# Patient Record
Sex: Female | Born: 1935
Health system: Southern US, Community
[De-identification: ages and names within clinical notes are randomized; demographics above are authoritative.]

## PROBLEM LIST (undated history)

## (undated) DIAGNOSIS — I739 Peripheral vascular disease, unspecified: Secondary | ICD-10-CM

## (undated) DIAGNOSIS — I10 Essential (primary) hypertension: Secondary | ICD-10-CM

## (undated) DIAGNOSIS — J449 Chronic obstructive pulmonary disease, unspecified: Secondary | ICD-10-CM

## (undated) HISTORY — DX: Peripheral vascular disease, unspecified: I73.9

## (undated) HISTORY — DX: Essential (primary) hypertension: I10

## (undated) HISTORY — PX: CYST EXCISION: SHX5701

---

## 2003-11-06 ENCOUNTER — Ambulatory Visit: Payer: Self-pay | Admitting: Orthopedic Surgery

## 2003-11-25 ENCOUNTER — Ambulatory Visit: Payer: Self-pay | Admitting: Orthopedic Surgery

## 2004-01-27 ENCOUNTER — Ambulatory Visit: Payer: Self-pay | Admitting: Orthopedic Surgery

## 2009-05-26 ENCOUNTER — Encounter: Payer: Self-pay | Admitting: Cardiovascular Disease

## 2009-05-29 ENCOUNTER — Encounter: Payer: Self-pay | Admitting: Cardiovascular Disease

## 2009-06-17 ENCOUNTER — Ambulatory Visit: Payer: Self-pay | Admitting: Cardiovascular Disease

## 2009-06-17 DIAGNOSIS — I70219 Atherosclerosis of native arteries of extremities with intermittent claudication, unspecified extremity: Secondary | ICD-10-CM | POA: Insufficient documentation

## 2010-02-03 NOTE — Assessment & Plan Note (Signed)
Summary: npv and coronary eval   Visit Type:  Initial Consult Referring Provider:  Dr. Linna Darner Primary Provider:  Geanie Berlin, MD  CC:  new patient coronary evaluation.  Pt reports some episodes of swelling but thinks it is due to hx of injury to ankle and hip replacement.  History of Present Illness: 77 year-old woman referred for initial evaluation of bilateral leg pain. She states that both legs have an aching, tired sensation. She denies cramps or aches in the calves. No sores or ulcerations. She has swelling of the right foot ever since an old fracture, but no leg edema. No chest pain, dyspnea, or other complaints. She leads a fairly sedentery lifestyle.  Preventive Screening-Counseling & Management  Alcohol-Tobacco     Smoking Status: current  Current Medications (verified): 1)  Meloxicam 15 Mg Tabs (Meloxicam) .... Take One Tablet By Mouth Once Daily 2)  Travatan Z 0.004 % Soln (Travoprost) .... Instill One Drop Daily Uad 3)  Timolol Maleate 0.25 % Soln (Timolol Maleate) .Marland Kitchen.. 1 Gtt in Left Eye Each Morning 4)  Lisinopril 20 Mg Tabs (Lisinopril) .... Take One Tablet Once Daily 5)  Alprazolam 0.5 Mg Tabs (Alprazolam) .... Take One Tablet Two Times A Day 6)  Ibuprofen 200 Mg Tabs (Ibuprofen) .... As Needed For Pain 7)  Aspirin 81 Mg Tabs (Aspirin) .... One Tablet Every Other Day  Allergies (verified): No Known Drug Allergies  Past History:  Past medical, surgical, family and social histories (including risk factors) reviewed, and no changes noted (except as noted below).  Past Medical History: Hypertension Osteoarthritis Lower extremity PAD  Past Surgical History: Left hip replacement 1998 Lumbar back surgery 1977 cataracts 2010 Remote appendectomy  Family History: Reviewed history and no changes required. Father - MI 62 Brother - HTN  Social History: Reviewed history and no changes required. Tobacco Use - Yes.  Widowed-3 children Homemaker Smoking Status:   current  Review of Systems       Positive for constipation, fatigue, anxiety, and depression. Otherwise negative except as per HPI  Vital Signs:  Patient profile:   77 year old female Height:      67 inches Weight:      128 pounds BMI:     20.12 Pulse rate:   100 / minute Pulse rhythm:   regular Resp:     16 per minute BP sitting:   184 / 88  (left arm) Cuff size:   regular  Vitals Entered By: Judithe Modest CMA (June 17, 2009 2:18 PM)   Physical Exam  General:  Pt is alert and oriented, anxious woman in no acute distress. HEENT: normal Neck: normal carotid upstrokes without bruits, JVP normal Lungs: CTA CV: RRR without murmur or gallop Abd: soft, NT, positive BS, no bruit, no organomegaly Ext: trace bilateral pedal edema.  Skin: warm and dry without rash    EKG  Procedure date:  06/17/2009  Findings:      NSR, cannot rule out age-indeterminate anterior infarct, nonspecific ST-T abnormality, HR 100 bpm.  ABI's  Procedure date:  05/29/2009  Findings:      Right ABI: 0.67 Left ABI: 0.59 Segmental pressure analysis suggestive of SFA and distal arterial disease bilaterally.   Impression & Recommendations:  Problem # 1:  ATHEROSLERO NATV ART EXTREM W/INTERMIT CLAUDICAT (ICD-440.21) The patient has evidence of moderate bilateral lower extremity PAD by exam and segmental pressures. The noninvasive studies were reviewed today and they suggest infrainguinal disease bilaterally. I do not think the patient has  evidence of critical limb ischemia and her leg pain is atypical. We discussed further evaluation with CTA, but she does not want to pursue this at the present time. Her transportation is limited and she is extremely anxious about further testing. It is reasonable to observe her for a period of time, and will therefore plan to see her back in 6 months for follow-up evaluation.   She is on appropriate medical therapy with an ACE-Inhibitor for HTN and antiplatelet Rx  with ASA. I would also recommend a statin to goal LDL less than 100 mg/dL. I will defer this to her PCP, Dr Linna Darner. We also reviewed the importance of tobacco cessation as it relates to PAD outcomes and risk of MI/stroke/etc.  If the patient develops progressive leg pain or symptoms of limb ischemia, I would be happy to see her back sooner in follow-up.  Orders: EKG w/ Interpretation (93000)  Patient Instructions: 1)  Your physician recommends that you continue on your current medications as directed. Please refer to the Current Medication list given to you today. 2)  Your physician wants you to follow-up in:   6 MONTHS. You will receive a reminder letter in the mail two months in advance. If you don't receive a letter, please call our office to schedule the follow-up appointment.

## 2010-02-03 NOTE — Letter (Signed)
Summary: [facility] Office Note  [facility] Note   Imported By: Roderic Ovens 06/18/2009 11:04:49  _____________________________________________________________________  External Attachment:    Type:   Image     Comment:   External Document

## 2010-06-10 ENCOUNTER — Other Ambulatory Visit: Payer: Self-pay | Admitting: Orthopedic Surgery

## 2010-06-10 ENCOUNTER — Ambulatory Visit (HOSPITAL_COMMUNITY)
Admission: RE | Admit: 2010-06-10 | Discharge: 2010-06-10 | Disposition: A | Discharge status: Home / Self Care | Payer: Medicare Other | Source: Ambulatory Visit | Attending: Orthopedic Surgery | Admitting: Orthopedic Surgery

## 2010-06-10 ENCOUNTER — Other Ambulatory Visit (HOSPITAL_COMMUNITY): Payer: Self-pay | Admitting: Orthopedic Surgery

## 2010-06-10 ENCOUNTER — Encounter (HOSPITAL_COMMUNITY): Payer: Medicare Other

## 2010-06-10 DIAGNOSIS — Z01818 Encounter for other preprocedural examination: Secondary | ICD-10-CM

## 2010-06-10 DIAGNOSIS — Z01812 Encounter for preprocedural laboratory examination: Secondary | ICD-10-CM | POA: Insufficient documentation

## 2010-06-10 DIAGNOSIS — R9431 Abnormal electrocardiogram [ECG] [EKG]: Secondary | ICD-10-CM | POA: Insufficient documentation

## 2010-06-10 DIAGNOSIS — I517 Cardiomegaly: Secondary | ICD-10-CM | POA: Insufficient documentation

## 2010-06-10 DIAGNOSIS — Z01810 Encounter for preprocedural cardiovascular examination: Secondary | ICD-10-CM | POA: Insufficient documentation

## 2010-06-10 DIAGNOSIS — Z01811 Encounter for preprocedural respiratory examination: Secondary | ICD-10-CM | POA: Insufficient documentation

## 2010-06-10 DIAGNOSIS — J438 Other emphysema: Secondary | ICD-10-CM | POA: Insufficient documentation

## 2010-06-10 LAB — DIFFERENTIAL
Basophils Absolute: 0.1 10*3/uL (ref 0.0–0.1)
Basophils Relative: 1 % (ref 0–1)
Eosinophils Absolute: 0.2 10*3/uL (ref 0.0–0.7)
Eosinophils Relative: 3 % (ref 0–5)
Lymphocytes Relative: 37 % (ref 12–46)
Monocytes Absolute: 0.4 10*3/uL (ref 0.1–1.0)

## 2010-06-10 LAB — URINALYSIS, ROUTINE W REFLEX MICROSCOPIC
Hgb urine dipstick: NEGATIVE
Nitrite: NEGATIVE
Protein, ur: NEGATIVE mg/dL
Specific Gravity, Urine: 1.014 (ref 1.005–1.030)
Urobilinogen, UA: 0.2 mg/dL (ref 0.0–1.0)

## 2010-06-10 LAB — CBC
HCT: 45 % (ref 36.0–46.0)
MCHC: 33.6 g/dL (ref 30.0–36.0)
Platelets: 251 10*3/uL (ref 150–400)
RDW: 12.6 % (ref 11.5–15.5)
WBC: 7.4 10*3/uL (ref 4.0–10.5)

## 2010-06-10 LAB — BASIC METABOLIC PANEL
CO2: 28 mEq/L (ref 19–32)
Chloride: 106 mEq/L (ref 96–112)
Creatinine, Ser: 0.67 mg/dL (ref 0.4–1.2)
GFR calc Af Amer: >60 mL/min (ref >60)
Glucose, Bld: 96 mg/dL (ref 70–99)
Sodium: 140 mEq/L (ref 135–145)

## 2010-06-10 LAB — ABO/RH: ABO/RH(D): O POS

## 2010-06-10 LAB — SURGICAL PCR SCREEN
MRSA, PCR: NEGATIVE
Staphylococcus aureus: NEGATIVE

## 2010-06-10 LAB — APTT: aPTT: 32 seconds (ref 24–37)

## 2010-06-10 LAB — URINE MICROSCOPIC-ADD ON

## 2010-06-10 LAB — PROTIME-INR: INR: 1.02 (ref 0.00–1.49)

## 2010-06-16 ENCOUNTER — Inpatient Hospital Stay (HOSPITAL_COMMUNITY): Payer: Medicare Other

## 2010-06-16 ENCOUNTER — Inpatient Hospital Stay (HOSPITAL_COMMUNITY)
Admission: RE | Admit: 2010-06-16 | Discharge: 2010-06-20 | DRG: 468 | Disposition: A | Discharge status: Home Health | Payer: Medicare Other | Source: Ambulatory Visit | Attending: Orthopedic Surgery | Admitting: Orthopedic Surgery

## 2010-06-16 DIAGNOSIS — T84039A Mechanical loosening of unspecified internal prosthetic joint, initial encounter: Principal | ICD-10-CM | POA: Diagnosis present

## 2010-06-16 DIAGNOSIS — F411 Generalized anxiety disorder: Secondary | ICD-10-CM | POA: Diagnosis present

## 2010-06-16 DIAGNOSIS — J438 Other emphysema: Secondary | ICD-10-CM | POA: Diagnosis present

## 2010-06-16 DIAGNOSIS — Z01812 Encounter for preprocedural laboratory examination: Secondary | ICD-10-CM

## 2010-06-16 DIAGNOSIS — I10 Essential (primary) hypertension: Secondary | ICD-10-CM | POA: Diagnosis present

## 2010-06-16 DIAGNOSIS — F17200 Nicotine dependence, unspecified, uncomplicated: Secondary | ICD-10-CM | POA: Diagnosis present

## 2010-06-16 DIAGNOSIS — Z96649 Presence of unspecified artificial hip joint: Secondary | ICD-10-CM

## 2010-06-16 DIAGNOSIS — Y831 Surgical operation with implant of artificial internal device as the cause of abnormal reaction of the patient, or of later complication, without mention of misadventure at the time of the procedure: Secondary | ICD-10-CM | POA: Diagnosis present

## 2010-06-16 LAB — TYPE AND SCREEN: ABO/RH(D): O POS

## 2010-06-16 NOTE — Op Note (Signed)
Tina Tucker, Tina Tucker             ACCOUNT NO.:  [id number]  MEDICAL RECORD NO.:  [id number]  LOCATION:  0006                         FACILITY:  [facility]  PHYSICIAN:  Madlyn Frankel. Charlann Boxer, M.D.  DATE OF BIRTH:  [birth year]  DATE OF PROCEDURE:  06/16/2010 DATE OF DISCHARGE:                              OPERATIVE REPORT   PREOPERATIVE DIAGNOSIS:  Failed left cemented hip hemiarthroplasty for previous fracture.  POSTOPERATIVE DIAGNOSIS:  Failed left cemented hip hemiarthroplasty for previous fracture.  PROCEDURE:  Conversion of previous left hip surgery to a left total hip revision, otherwise, noted a revision left hip replacement.  COMPONENTS USED:  DePuy hip system with size 52 pinnacle cup, 2 cancellous screws, a 36 +4 neutral AltrX liner and used an 8-inch solution stem 16.5 mm, small body, with a 36 +5 metal ball.  Please note that 2 cables were utilized as well a Zimmer cable.  SURGEON:  Madlyn Frankel. Charlann Boxer, M.D.  ASSISTANT:  Jaquelyn Bitter. Chabon, P.A.  ANESTHESIA:  General.  BLOOD LOSS:  About 400 cc.  DRAINS:  One Hemovac.  COMPLICATIONS:  None.  SPECIMEN:  None.  INDICATIONS FOR PROCEDURE:  Ms. Tina Tucker is a 77 year old female who presented to office for evaluation of her left hip.  She had had a previous hip hemiarthroplasty done 14 years ago in the [place] area.  She is noted to have increasing pain and discomfort. Radiographs revealed a fractured cement mantle indicating loosening of femoral stem.  After reviewing with her the implications of findings and the procedure necessary to perform she was ready at this point to proceed.  Risks of infection, DVT, component failure, dislocation were all discussed and reviewed.  Consent was obtained for the above.  PROCEDURE IN DETAIL:  The patient was brought to operative theater. Once adequate anesthesia, preoperative antibiotics, Ancef administered, the patient was positioned into the right lateral decubitus position with  left side up.  The left lower extremity was then prepped and draped in sterile fashion to allow for exposure of the femur distally.  A time-out was performed identifying the patient, planned procedure and the extremity.  The patient's old incision was identified pre-draping, I initially used a portion of this and extended it posterior for posterior approach.  The iliotibial band and gluteal fascia were noted were dissected out and then incised for posterior approach, the previous approach was an anterior approach as there were retained Ethibond sutures in the anterior aspect of the trochanter.  Following the exposure through the gluteus maximus, the short external rotators were taken down separate from the posterior capsule and L capsulotomy was made.  Femoral head was dislocated and the femoral stem noted to be loose.  Following initial exposure and debridement of proximal femur, I used S-ROM extractor and was able to remove the femoral stem and some of the proximal cement without difficulty. Proximal cement was then removed under direct visualization.  There was radiographically identified remaining cement distally including the cement restrictor.  I used the Gurdon drill sets to try drill through this area and then be able to remove with back-scratching devices.  As a drilled into this and tried to use back-scratches, I was able to remove some  cement but I was concerned about perforating the posterior cortex.  Once it was identified that this with the possibility, I went ahead and stopped without doing the proximal end.  I then extended my incision distally and then split the iliotibial band and then elevated the vastus lateralis off the posterior aspect of the femur and retracted it anteriorly.  We did identify this posterior split.  The femur head itself remained intact other than the split.  At this point, we asked the staff to open up the Zimmer cable set.  I subsequently  used the split in the femur as a window for direct visualization as well as to remove cement fragments and some plastic. Via this window, I was able to completely remove the remaining cement distally.  Once the cement mantle was removed and the cement restrictor removed, under direct visualization through this window I placed the Zimmer cable distal to this area where the posterior aspect of cortex had been perforated.  The Zimmer cable was tensioned down with the tension portion left in place.  At this point, the femur was flexed forward again.  I was able to begin reaming, I reamed up to 16 mm, reamer with decent purchase.  I passed by hand the 16.5 reamer, found that I was now at least 4 to 5 cm of some contact to bone for a final prosthetic component.  I chose to use an 8- inch solution stem for more purchase on bone.  Once I prepared the femoral shaft, I went ahead to the acetabulum. Acetabular retractors were placed.  Labrum and soft tissue debrided.  I began reaming with the 46 reamer, reamed up to 51 reamer with good bony bed preparation.  A 52 pinnacle cup was then impacted with a curved impactor.  Two cancellous screws were placed into the ilium.  Based on the cup position, I went ahead and placed a 36 +4 neutral AltrX liner.  At this point, I chose the 8-inch small body 16.5 mm solution stem and it was opened on the back table.  Using a reamer in the proximal hole, I was able to set my anteversion at appropriate level at 20 degrees.  Please note that I did had previously slit a trial component down to get an idea leg length and position of stem.  The final stem was then impacted to the level where I had gotten the trial to seat.  I did retrial at this point and determined I would use a 36 +5 ball. Combined anteversion was noted to be at 45 to 50 degrees.  There was no evidence of impingement or subluxation.  Her leg lengths appeared to be comparably to one another and  there was no shuck on extension consistent with previous surgery.  Given all these findings, the final 36 + 5 ball was chosen and impacted on to clean and dry trunnion.  At this point, a second Zimmer cable was placed in the midportion of stem at the area of the perforated posterior femur.  Both were tensioned down appropriately and the cables clipped. Following irrigation, I reapproximated posterior capsule superior leaflet using #1 Vicryl.  A medium Hemovac drain was placed deep. Iliotibial band distally was then reapproximated over the vastus lateralis using #1 Vicryl.  This was extended to the posterior gluteal fascia.  The remainder of the wound was closed with 2-0 Vicryl and staples on the skin.  The skin was cleaned, dried and dressed sterilely using Mepilex dressing and she  was brought to recovery room in stable condition tolerating the procedure well.     Madlyn Frankel Charlann Boxer, M.D.     MDO/MEDQ  D:  06/16/2010  T:  06/16/2010  Job:  [id number]  Electronically Signed by Durene Romans M.D. on 06/16/2010 04:28:31 PM

## 2010-06-16 NOTE — H&P (Signed)
  NAMEMAYELA, Tina Tucker             ACCOUNT NO.:  [id number]  MEDICAL RECORD NO.:  [id number]  LOCATION:  1613                         FACILITY:  [facility]  PHYSICIAN:  Madlyn Frankel. Charlann Boxer, M.D.  DATE OF BIRTH:  1932/03/04  DATE OF ADMISSION:  06/16/2010 DATE OF DISCHARGE:                             HISTORY & PHYSICAL   ADMITTING DIAGNOSIS:  Failed left hip hemiarthroplasty.  HISTORY OF PRESENT ILLNESS:  Tina Tucker is a 77 year old female, who presented to the office for evaluation of left hip pain.  She had had a previous left hip hemiarthroplasty cemented done 14 years ago.  She had radiographic evidence of a fracture cement mantle consistent with her pain with the diagnosis of an aseptic loosening of the left hip hemiarthroplasty, which is the bipolar prosthetic component.  She had increasing pain.  We reviewed her options.  She wished at this point to proceed with surgical intervention.  Risks and benefits were reviewed in the office.  Consent was obtained there.  PAST MEDICAL HISTORY:  Includes hypertension.  PAST SURGICAL HISTORY:  Includes left hip hemiarthroplasty for fracture in 1998.  SOCIAL HISTORY:  She does report smoking.  She lives in her home by herself.  She is widowed.  HOME MEDICATIONS:  Include: 1. Hydrocodone for pain. 2. Ibuprofen. 3. Calcium. 4. Alprazolam. 5. Amlodipine. 6. Lisinopril. 7. Travatan. 8. Atenolol. 9. She is also placed on Cipro.  DRUG ALLERGIES:  No known drug allergies.  PHYSICAL EXAMINATION:  GENERAL:  Tina Tucker was seen and evaluated in the office.  She was awake, alert and oriented, using assistance due to discomfort. VITAL SIGNS:  Pulse of 60, blood pressure of 120/78. HEENT:  She is otherwise awake, alert and.  She wears dentures.  She has no glasses. RESPIRATORY:  She had no wheezing, shortness breath with exertion. CHEST:  Clear to auscultation. HEART: Had a normal rate and rhythm. ABDOMEN:  Soft and  nontender. EXTREMITIES:  Examination of her lower extremities revealed no signs of any cellulitic change.  No venous or lymphatic congestion.  She had painful range of motion with tenderness to palpation on the left thigh. She otherwise had intact motor and sensory function.  LABORATORY DATA:  Her preoperative labs, EKG were all pending her [facility] evaluation and available in the system.  Plain films of the hip are available through our evaluation.  ASSESSMENT:  Failed left hip hemiarthroplasty cemented bipolar prosthetic due to fractured cement mantle.  PLAN:  At this point, Tina Tucker will be admitted for surgery on June 16, 2010 for a revision left hip surgery  to have conversion from previous failed hip surgery to a left total hip replacement.  Questions were encouraged and reviewed.  She would at this point probably plan to be transferred to a skilled nursing facility postoperatively.     Madlyn Frankel Charlann Boxer, M.D.     MDO/MEDQ  D:  06/16/2010  T:  06/16/2010  Job:  [id number]  Electronically Signed by Durene Romans M.D. on 06/16/2010 08:14:47 PM

## 2010-06-17 LAB — CBC
HCT: 31.6 % — ABNORMAL LOW (ref 36.0–46.0)
Hemoglobin: 10.5 g/dL — ABNORMAL LOW (ref 12.0–15.0)
MCH: 29.8 pg (ref 26.0–34.0)
MCV: 89.8 fL (ref 78.0–100.0)
Platelets: 180 10*3/uL (ref 150–400)
RBC: 3.52 MIL/uL — ABNORMAL LOW (ref 3.87–5.11)
WBC: 7.4 10*3/uL (ref 4.0–10.5)

## 2010-06-17 LAB — BASIC METABOLIC PANEL
CO2: 27 mEq/L (ref 19–32)
Calcium: 7.9 mg/dL — ABNORMAL LOW (ref 8.4–10.5)
Chloride: 101 mEq/L (ref 96–112)
Creatinine, Ser: 0.55 mg/dL (ref 0.4–1.2)
Glucose, Bld: 130 mg/dL — ABNORMAL HIGH (ref 70–99)

## 2010-06-18 LAB — BASIC METABOLIC PANEL
BUN: 7 mg/dL (ref 6–23)
CO2: 25 mEq/L (ref 19–32)
Calcium: 8.3 mg/dL — ABNORMAL LOW (ref 8.4–10.5)
Chloride: 104 mEq/L (ref 96–112)
Creatinine, Ser: <0.47 mg/dL (ref 0.4–1.2)

## 2010-06-18 LAB — CBC
HCT: 29.2 % — ABNORMAL LOW (ref 36.0–46.0)
MCH: 29.8 pg (ref 26.0–34.0)
MCV: 88.8 fL (ref 78.0–100.0)
Platelets: 143 10*3/uL — ABNORMAL LOW (ref 150–400)
RDW: 12.7 % (ref 11.5–15.5)
WBC: 9.4 10*3/uL (ref 4.0–10.5)

## 2010-08-08 NOTE — Discharge Summary (Signed)
Tina Tucker, Tina Tucker             ACCOUNT NO.:  [id number]  MEDICAL RECORD NO.:  [id number]  LOCATION:  1613                         FACILITY:  [facility]  PHYSICIAN:  Madlyn Frankel. Charlann Boxer, M.D.  DATE OF BIRTH:  1932/10/29  DATE OF ADMISSION:  06/16/2010 DATE OF DISCHARGE:  06/20/2010                              DISCHARGE SUMMARY   PROCEDURE:  Left total hip arthroplasty revision.  ADMITTING DIAGNOSIS:  Failed left hip hemiarthroplasty.  DISCHARGE DIAGNOSES: 1. Status post left total hip arthroplasty revision. 2. Hypertension.  HISTORY OF PRESENT ILLNESS:  The patient is a 77 year old female who presented to the office for evaluation of left hip pain.  She had a previous left hip hemiarthroplasty, cemented approximately 14 years ago. On x-rays at the clinic, she had radiographic evidence of fractured cement mantle, consistent with her pain with a diagnosis of aseptic loosening of the left hip hemiarthroplasty.  Her component is a bipolar prosthetic component.  Due to her increasing pain, various options were discussed.  The patient wishes to proceed with surgery.  Risks, benefits, and expectations of the procedure were discussed with the patient.  The patient understands these risks, benefits, and expectations and wishes to proceed with surgery.  HOSPITAL COURSE:  The patient underwent the above-stated procedure on June 16, 2010.  The patient tolerated the procedure well, was brought to the recovery room in good condition and subsequently to the floor.  On postop day #1, June 17, 2010, the patient was doing well with no events. Afebrile, vital signs stable.  Hematocrit was 31.6.  Left hip was clean, dry, and intact.  The Hemovac was removed.  The patient started physical therapy to partial weightbearing on left lower extremity.  The patient was neurovascularly intact.  X-rays were reviewed and looked good.  On postop day #2, June 18, 2010, the patient was feeling well.  Vital  signs stable, afebrile.  Ins and outs were good.  Lab work is good. Hemoglobin 9.8, hematocrit 29.2.  Lungs were clear.  Heart sounds normal as her bowel sounds. Dressing was dry and intact.  Postop day #3, June 19, 2010, the patient is feeling okay, worried about walking.  Vital signs were stable.  Oxygen was 98% on room air, was afebrile.  Lungs were clear.  Heart sounds were good.  Bowel sounds sluggish.  Dressing was dry and the patient was ambulating with PT.  Postop day #4, June 20, 2010, the patient was doing well, afebrile, vital signs stable and is neurovascularly intact.  The wound looked good. The patient was doing well enough to be discharge to home.  DISCHARGE CONDITION:  Good.  DISCHARGE INSTRUCTIONS:  The patient is to be discharged home, will be partial weightbearing 50% on left lower extremity.  The patient will maintain her surgical dressing for about 5 days and replace it with a dry bandage.  The patient is to keep the area dry and clean until followup.  The patient will follow up in 2 weeks with Dr. Charlann Boxer at [facility].  The patient is to call with any questions or concerns.  DISCHARGE MEDICATIONS: 1. Aspirin enteric coated 325 mg 1 p.o. b.i.d. for 30 days. 2. Colace 100 mg  1 p.o. b.i.d. p.r.n. constipation. 3. Iron sulfate 325 mg 1 p.o. t.i.d. for 2 or 3 weeks. 4. Robaxin 500 mg 1 p.o. q.6 h. p.r.n. muscle spasms. 5. Nicotine 21 mcg/24 hour patch 1 transdermally daily. 6. Norco 7.5/325 one to two p.o. q.4-6 hours p.r.n. pain. 7. MiraLax 17 g 1 p.o. daily p.r.n. constipation. 8. Alprazolam 0.5 mg 1 p.o. q.8 h. p.r.n. 9. Amlodipine 5 mg 1 p.o. q.a.m. 10.Calcium citrate/vitamin D 1 p.o. daily. 11.Lisinopril 20 mg 1 p.o. nightly. 12.Timolol ophthalmic solution 0.25% 1 drop both eyes q.a.m. 13.Travatan ophthalmic 0.004% 1 drop in both eyes nightly.    ______________________________ Lanney Gins, PA   ______________________________ Madlyn Frankel.  Charlann Boxer, M.D.    MB/MEDQ  D:  08/06/2010  T:  08/07/2010  Job:  [id number]  Electronically Signed by Lanney Gins PA on 08/07/2010 10:26:39 AM Electronically Signed by Durene Romans M.D. on 08/08/2010 09:16:47 AM

## 2012-05-29 IMAGING — CR DG CHEST 2V
2 series · 2 of 2 positions shown · non-contrast
Comparison: None.

CLINICAL DATA: Preoperative respiratory evaluation.

CHEST - 2 VIEW

[w chest pa]
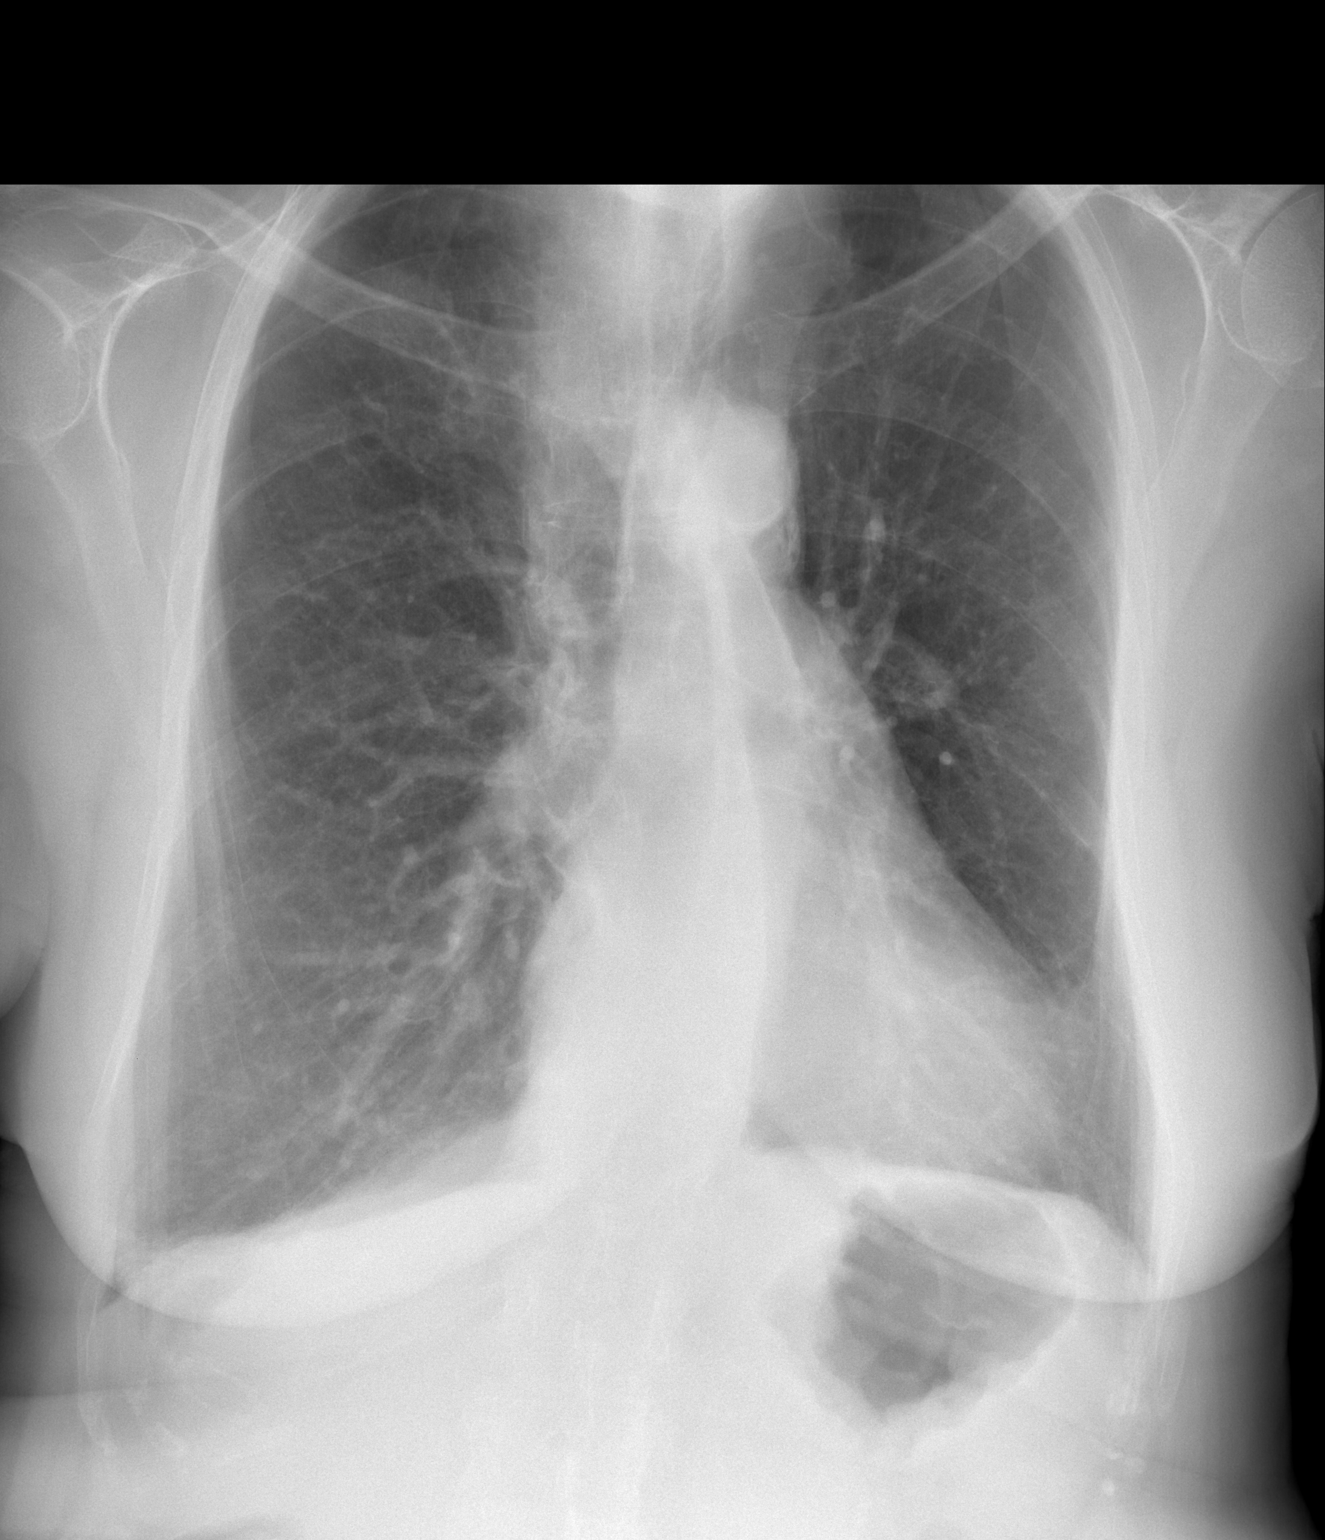

[w chest lat]
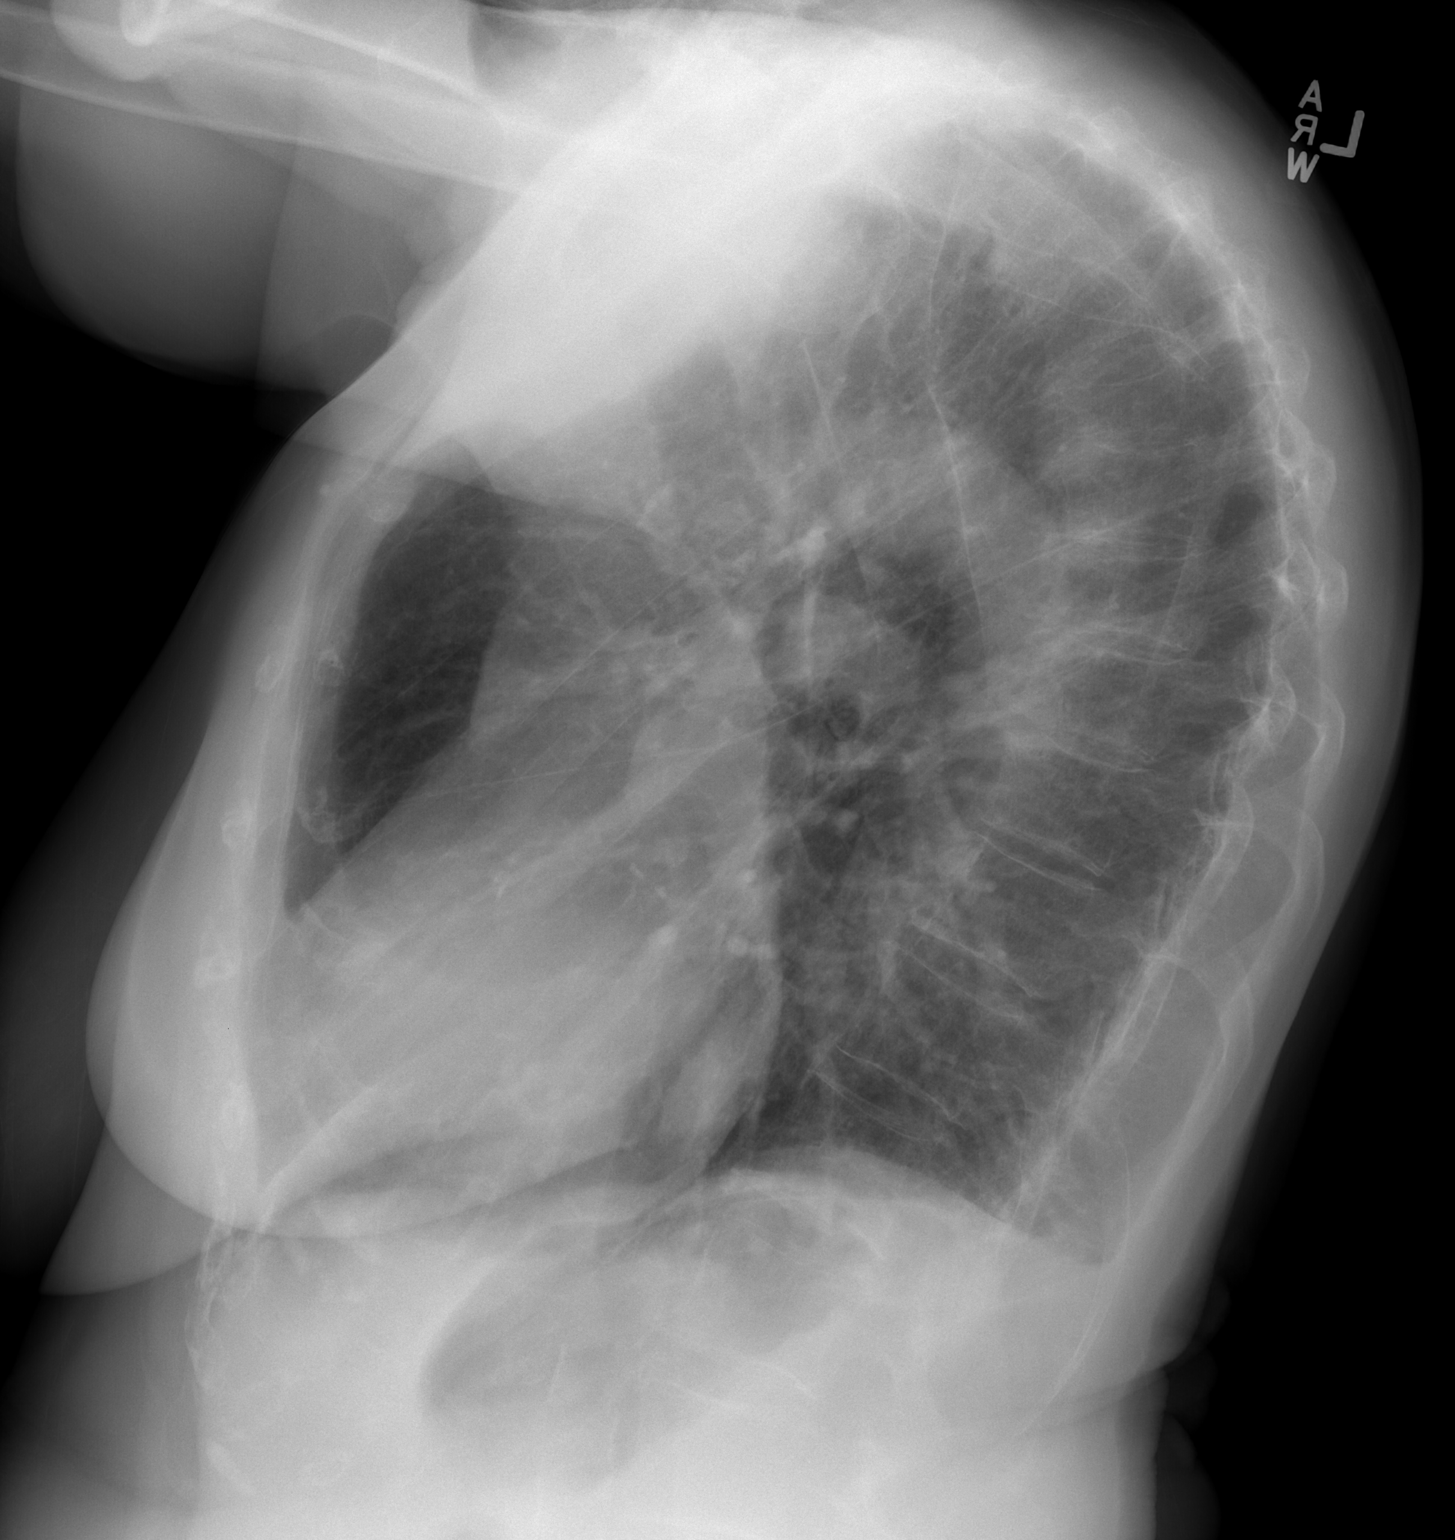

[2 of 2 positions shown; findings below may reference images not displayed]

FINDINGS: Hyperexpansion is consistent with emphysema. The lungs
are clear without focal infiltrate, edema, pneumothorax or pleural
effusion. The cardiopericardial silhouette is enlarged. Bones are
diffusely demineralized.
IMPRESSION: Cardiomegaly with emphysema.  No acute cardiopulmonary process.

## 2012-06-04 IMAGING — CR DG HIP 1V PORT*L*
1 series · 2 of 2 positions shown · non-contrast
Comparison: None.

CLINICAL DATA: Status post hip replacement.

PORTABLE LEFT HIP - 1 VIEW

[Series 1: shoot- thru lateral · left · 2 of 2 slices shown]
[im 1/2]
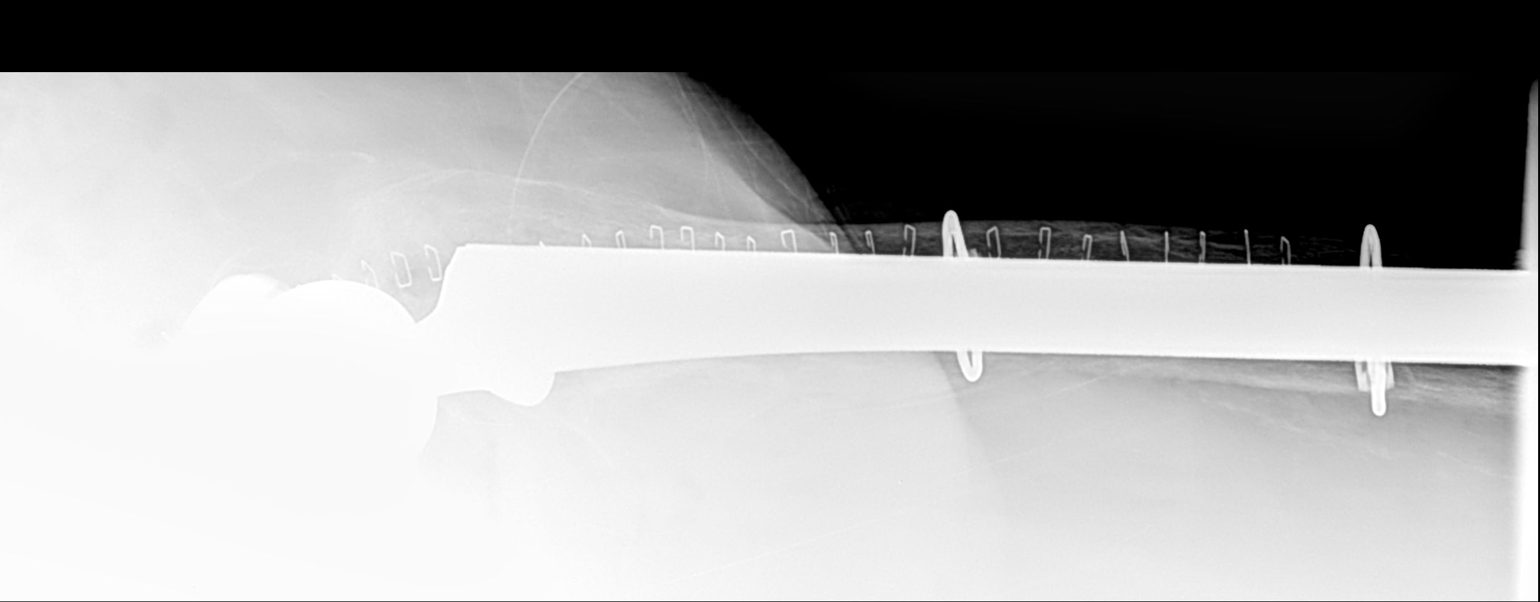
[im 2/2]
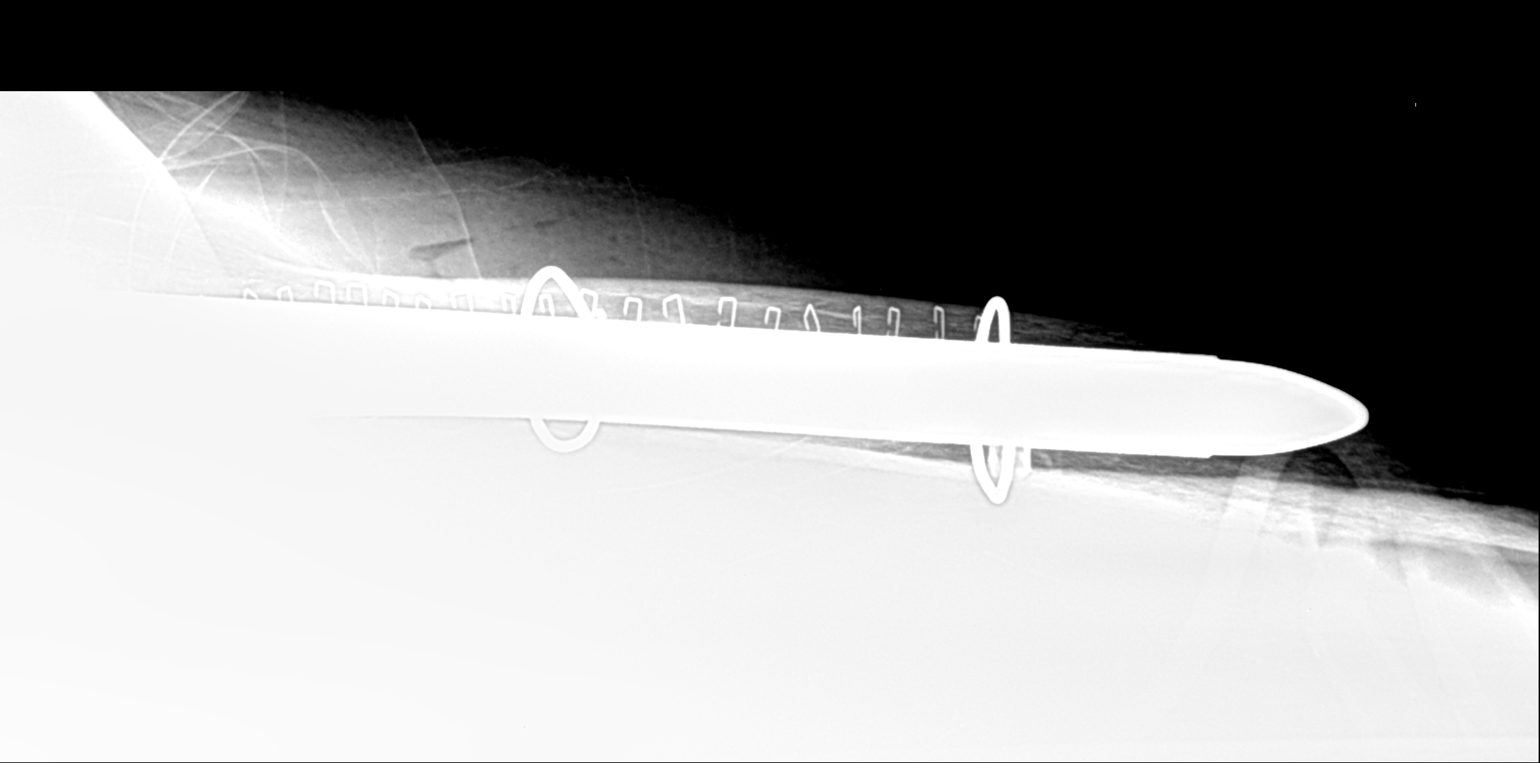

[2 of 2 positions shown; findings below may reference images not displayed]

FINDINGS: Left total hip arthroplasties in place. The device is
located.  Bone about the distal aspect of the femoral stem is not
visualized due to technique.  No fracture is identified.  Surgical
staples and drain noted.
IMPRESSION: Left total hip.  Bone about the distal end of the femoral stem is
not visible due to technical factors.  Please see report of AP view
of the pelvis.

## 2012-06-04 IMAGING — CR DG PORTABLE PELVIS
2 series · 2 of 2 positions shown · non-contrast
Comparison: None.

CLINICAL DATA: Status post left hip replacement.

PORTABLE PELVIS

[AP (1 of 2)]
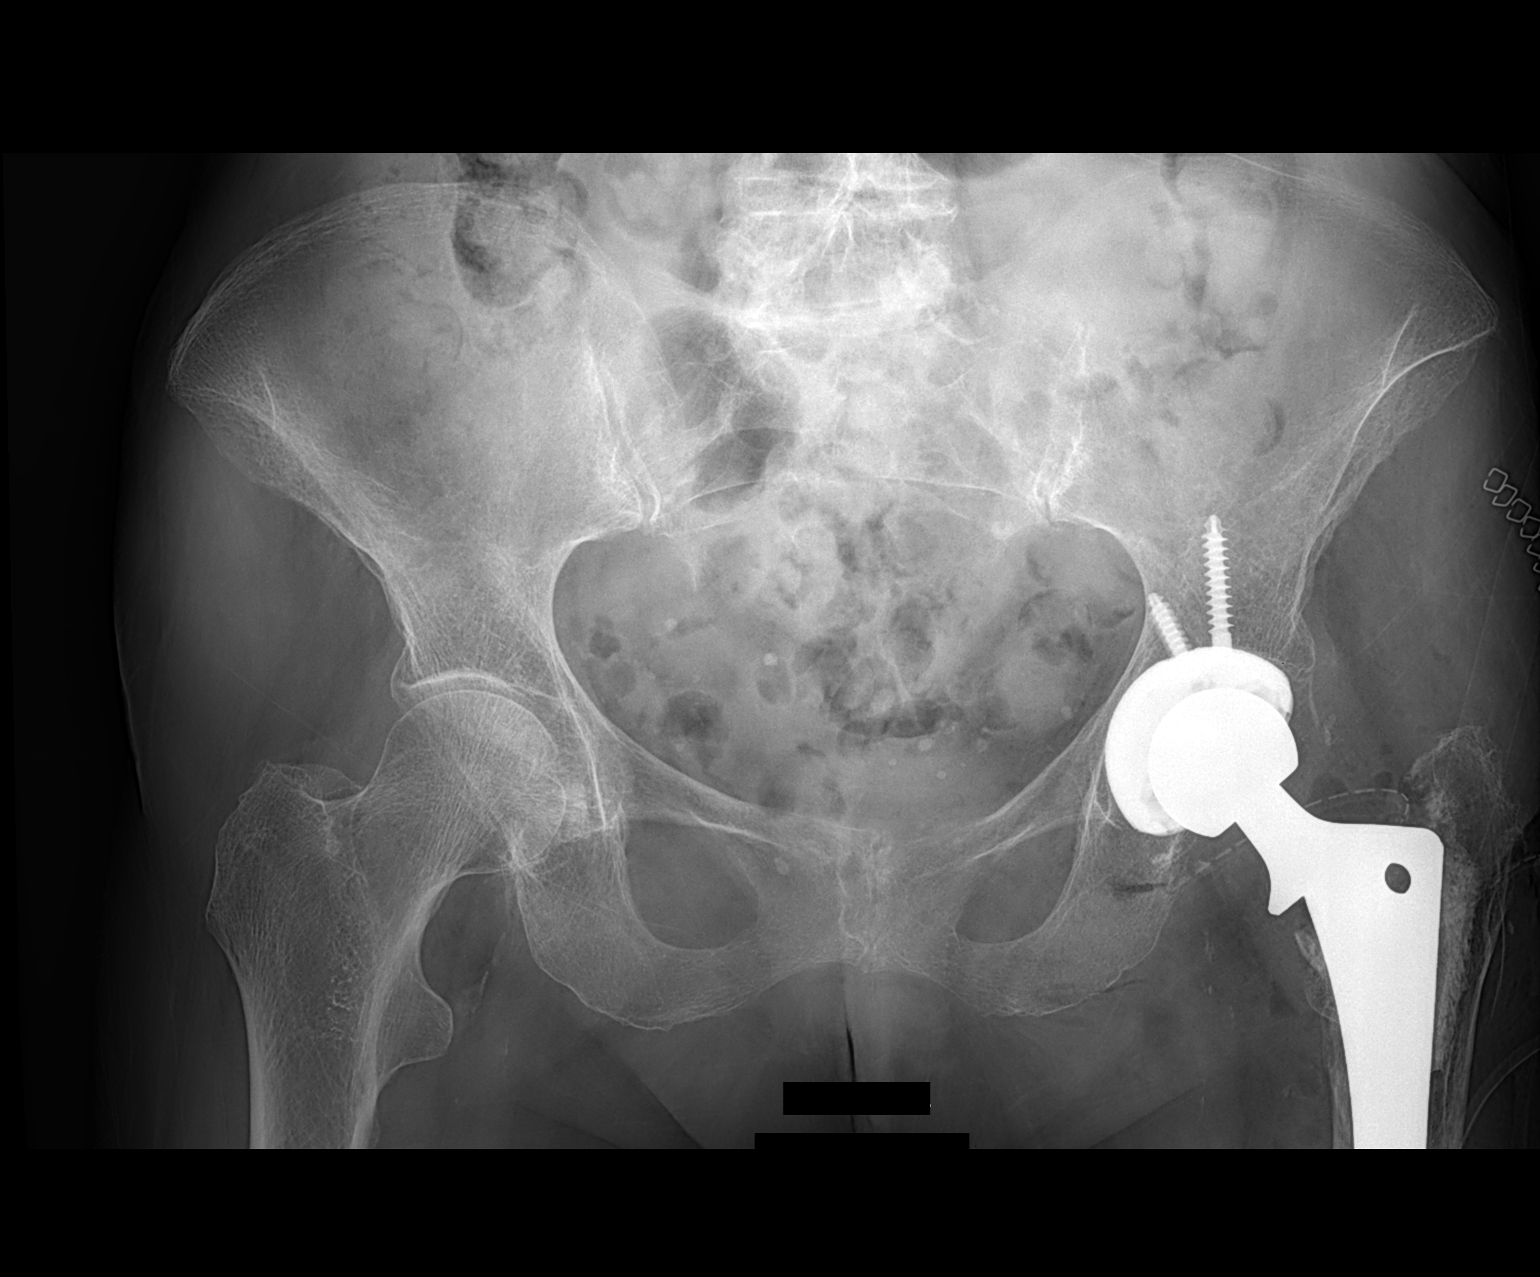

[AP (2 of 2)]
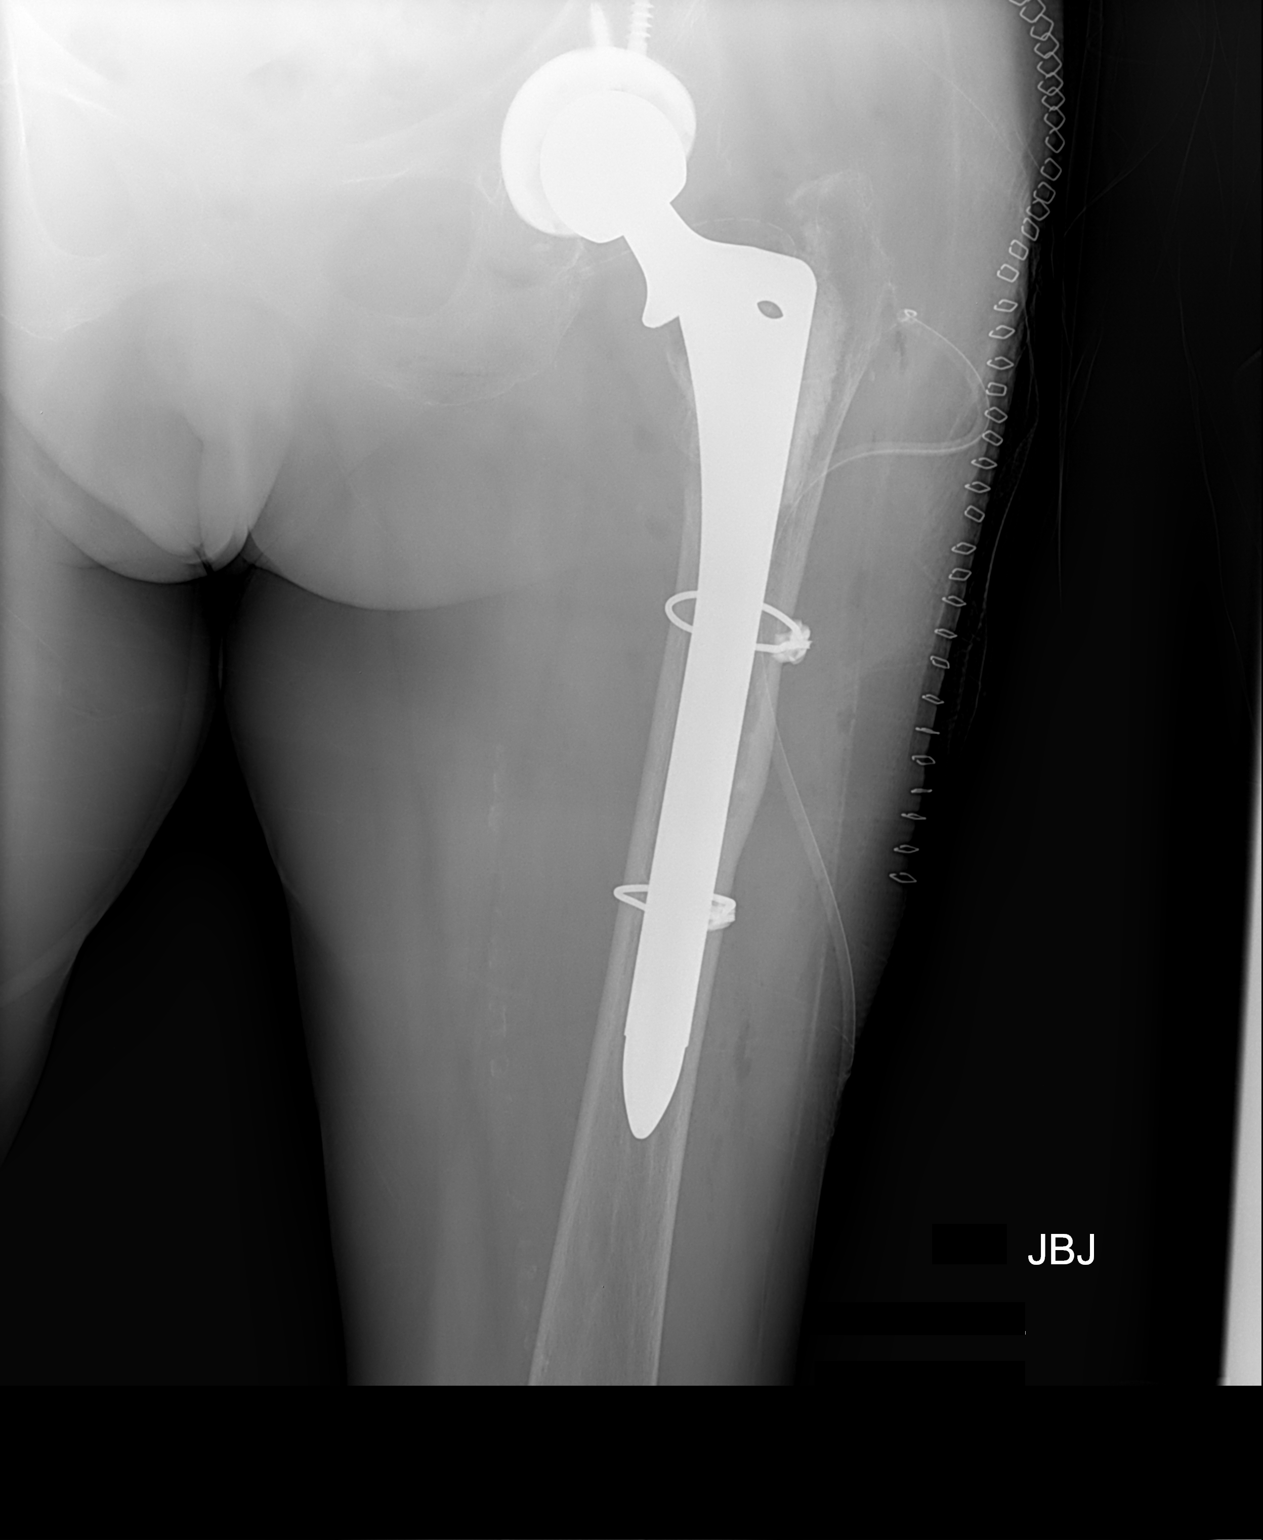

[2 of 2 positions shown; findings below may reference images not displayed]

FINDINGS: The patient has a new left total hip arthroplasty.  The
device is located.  There is no fracture.  Surgical drain and
staples noted.
IMPRESSION: Left total hip without evidence of complication.

## 2015-02-15 DIAGNOSIS — M25541 Pain in joints of right hand: Secondary | ICD-10-CM | POA: Diagnosis not present

## 2015-02-15 DIAGNOSIS — M79641 Pain in right hand: Secondary | ICD-10-CM | POA: Diagnosis not present

## 2015-02-15 DIAGNOSIS — F17200 Nicotine dependence, unspecified, uncomplicated: Secondary | ICD-10-CM | POA: Diagnosis not present

## 2015-02-15 DIAGNOSIS — M19041 Primary osteoarthritis, right hand: Secondary | ICD-10-CM | POA: Diagnosis not present

## 2015-02-27 DIAGNOSIS — I10 Essential (primary) hypertension: Secondary | ICD-10-CM | POA: Diagnosis not present

## 2015-02-27 DIAGNOSIS — Z0000 Encounter for general adult medical examination without abnormal findings: Secondary | ICD-10-CM | POA: Diagnosis not present

## 2015-02-27 DIAGNOSIS — Z6820 Body mass index (BMI) 20.0-20.9, adult: Secondary | ICD-10-CM | POA: Diagnosis not present

## 2015-02-27 DIAGNOSIS — M25532 Pain in left wrist: Secondary | ICD-10-CM | POA: Diagnosis not present

## 2015-02-27 DIAGNOSIS — Z1389 Encounter for screening for other disorder: Secondary | ICD-10-CM | POA: Diagnosis not present

## 2015-05-27 DIAGNOSIS — J440 Chronic obstructive pulmonary disease with acute lower respiratory infection: Secondary | ICD-10-CM | POA: Diagnosis not present

## 2015-05-27 DIAGNOSIS — Z6820 Body mass index (BMI) 20.0-20.9, adult: Secondary | ICD-10-CM | POA: Diagnosis not present

## 2015-05-27 DIAGNOSIS — F064 Anxiety disorder due to known physiological condition: Secondary | ICD-10-CM | POA: Diagnosis not present

## 2015-05-27 DIAGNOSIS — I10 Essential (primary) hypertension: Secondary | ICD-10-CM | POA: Diagnosis not present

## 2015-08-26 DIAGNOSIS — Z6820 Body mass index (BMI) 20.0-20.9, adult: Secondary | ICD-10-CM | POA: Diagnosis not present

## 2015-08-26 DIAGNOSIS — J440 Chronic obstructive pulmonary disease with acute lower respiratory infection: Secondary | ICD-10-CM | POA: Diagnosis not present

## 2015-08-26 DIAGNOSIS — F064 Anxiety disorder due to known physiological condition: Secondary | ICD-10-CM | POA: Diagnosis not present

## 2015-08-26 DIAGNOSIS — I10 Essential (primary) hypertension: Secondary | ICD-10-CM | POA: Diagnosis not present

## 2015-09-09 DIAGNOSIS — H524 Presbyopia: Secondary | ICD-10-CM | POA: Diagnosis not present

## 2015-09-09 DIAGNOSIS — H43813 Vitreous degeneration, bilateral: Secondary | ICD-10-CM | POA: Diagnosis not present

## 2015-09-10 DIAGNOSIS — I10 Essential (primary) hypertension: Secondary | ICD-10-CM | POA: Diagnosis not present

## 2015-09-10 DIAGNOSIS — F064 Anxiety disorder due to known physiological condition: Secondary | ICD-10-CM | POA: Diagnosis not present

## 2015-09-10 DIAGNOSIS — J440 Chronic obstructive pulmonary disease with acute lower respiratory infection: Secondary | ICD-10-CM | POA: Diagnosis not present

## 2015-11-06 DIAGNOSIS — J440 Chronic obstructive pulmonary disease with acute lower respiratory infection: Secondary | ICD-10-CM | POA: Diagnosis not present

## 2015-11-06 DIAGNOSIS — I10 Essential (primary) hypertension: Secondary | ICD-10-CM | POA: Diagnosis not present

## 2015-11-06 DIAGNOSIS — F064 Anxiety disorder due to known physiological condition: Secondary | ICD-10-CM | POA: Diagnosis not present

## 2015-11-28 DIAGNOSIS — I10 Essential (primary) hypertension: Secondary | ICD-10-CM | POA: Diagnosis not present

## 2015-11-28 DIAGNOSIS — F064 Anxiety disorder due to known physiological condition: Secondary | ICD-10-CM | POA: Diagnosis not present

## 2015-11-28 DIAGNOSIS — J440 Chronic obstructive pulmonary disease with acute lower respiratory infection: Secondary | ICD-10-CM | POA: Diagnosis not present

## 2015-11-28 DIAGNOSIS — Z6820 Body mass index (BMI) 20.0-20.9, adult: Secondary | ICD-10-CM | POA: Diagnosis not present

## 2015-12-12 DIAGNOSIS — F064 Anxiety disorder due to known physiological condition: Secondary | ICD-10-CM | POA: Diagnosis not present

## 2015-12-12 DIAGNOSIS — I10 Essential (primary) hypertension: Secondary | ICD-10-CM | POA: Diagnosis not present

## 2015-12-12 DIAGNOSIS — J440 Chronic obstructive pulmonary disease with acute lower respiratory infection: Secondary | ICD-10-CM | POA: Diagnosis not present

## 2016-01-08 DIAGNOSIS — I10 Essential (primary) hypertension: Secondary | ICD-10-CM | POA: Diagnosis not present

## 2016-01-08 DIAGNOSIS — F064 Anxiety disorder due to known physiological condition: Secondary | ICD-10-CM | POA: Diagnosis not present

## 2016-01-08 DIAGNOSIS — J440 Chronic obstructive pulmonary disease with acute lower respiratory infection: Secondary | ICD-10-CM | POA: Diagnosis not present

## 2016-02-26 DIAGNOSIS — J440 Chronic obstructive pulmonary disease with acute lower respiratory infection: Secondary | ICD-10-CM | POA: Diagnosis not present

## 2016-02-26 DIAGNOSIS — I10 Essential (primary) hypertension: Secondary | ICD-10-CM | POA: Diagnosis not present

## 2016-02-26 DIAGNOSIS — F064 Anxiety disorder due to known physiological condition: Secondary | ICD-10-CM | POA: Diagnosis not present

## 2016-02-27 DIAGNOSIS — Z6821 Body mass index (BMI) 21.0-21.9, adult: Secondary | ICD-10-CM | POA: Diagnosis not present

## 2016-02-27 DIAGNOSIS — F064 Anxiety disorder due to known physiological condition: Secondary | ICD-10-CM | POA: Diagnosis not present

## 2016-02-27 DIAGNOSIS — I10 Essential (primary) hypertension: Secondary | ICD-10-CM | POA: Diagnosis not present

## 2016-02-27 DIAGNOSIS — J440 Chronic obstructive pulmonary disease with acute lower respiratory infection: Secondary | ICD-10-CM | POA: Diagnosis not present

## 2016-03-25 DIAGNOSIS — Z1389 Encounter for screening for other disorder: Secondary | ICD-10-CM | POA: Diagnosis not present

## 2016-03-25 DIAGNOSIS — Z6820 Body mass index (BMI) 20.0-20.9, adult: Secondary | ICD-10-CM | POA: Diagnosis not present

## 2016-03-25 DIAGNOSIS — I10 Essential (primary) hypertension: Secondary | ICD-10-CM | POA: Diagnosis not present

## 2016-03-25 DIAGNOSIS — Z0000 Encounter for general adult medical examination without abnormal findings: Secondary | ICD-10-CM | POA: Diagnosis not present

## 2016-04-19 DIAGNOSIS — J441 Chronic obstructive pulmonary disease with (acute) exacerbation: Secondary | ICD-10-CM | POA: Diagnosis not present

## 2016-04-19 DIAGNOSIS — F064 Anxiety disorder due to known physiological condition: Secondary | ICD-10-CM | POA: Diagnosis not present

## 2016-04-19 DIAGNOSIS — I10 Essential (primary) hypertension: Secondary | ICD-10-CM | POA: Diagnosis not present

## 2016-05-25 DIAGNOSIS — Z6820 Body mass index (BMI) 20.0-20.9, adult: Secondary | ICD-10-CM | POA: Diagnosis not present

## 2016-05-25 DIAGNOSIS — I10 Essential (primary) hypertension: Secondary | ICD-10-CM | POA: Diagnosis not present

## 2016-05-25 DIAGNOSIS — M545 Low back pain: Secondary | ICD-10-CM | POA: Diagnosis not present

## 2016-08-24 DIAGNOSIS — J440 Chronic obstructive pulmonary disease with acute lower respiratory infection: Secondary | ICD-10-CM | POA: Diagnosis not present

## 2016-08-24 DIAGNOSIS — Z681 Body mass index (BMI) 19 or less, adult: Secondary | ICD-10-CM | POA: Diagnosis not present

## 2016-08-24 DIAGNOSIS — I10 Essential (primary) hypertension: Secondary | ICD-10-CM | POA: Diagnosis not present

## 2016-08-24 DIAGNOSIS — M545 Low back pain: Secondary | ICD-10-CM | POA: Diagnosis not present

## 2016-11-01 DIAGNOSIS — M545 Low back pain: Secondary | ICD-10-CM | POA: Diagnosis not present

## 2016-11-01 DIAGNOSIS — J441 Chronic obstructive pulmonary disease with (acute) exacerbation: Secondary | ICD-10-CM | POA: Diagnosis not present

## 2016-11-01 DIAGNOSIS — I10 Essential (primary) hypertension: Secondary | ICD-10-CM | POA: Diagnosis not present

## 2016-11-02 DIAGNOSIS — H524 Presbyopia: Secondary | ICD-10-CM | POA: Diagnosis not present

## 2016-11-23 DIAGNOSIS — M545 Low back pain: Secondary | ICD-10-CM | POA: Diagnosis not present

## 2016-11-23 DIAGNOSIS — I10 Essential (primary) hypertension: Secondary | ICD-10-CM | POA: Diagnosis not present

## 2016-11-23 DIAGNOSIS — J440 Chronic obstructive pulmonary disease with acute lower respiratory infection: Secondary | ICD-10-CM | POA: Diagnosis not present

## 2016-11-23 DIAGNOSIS — Z6820 Body mass index (BMI) 20.0-20.9, adult: Secondary | ICD-10-CM | POA: Diagnosis not present

## 2016-12-16 DIAGNOSIS — M545 Low back pain: Secondary | ICD-10-CM | POA: Diagnosis not present

## 2016-12-16 DIAGNOSIS — I10 Essential (primary) hypertension: Secondary | ICD-10-CM | POA: Diagnosis not present

## 2016-12-16 DIAGNOSIS — J441 Chronic obstructive pulmonary disease with (acute) exacerbation: Secondary | ICD-10-CM | POA: Diagnosis not present

## 2017-02-24 DIAGNOSIS — M545 Low back pain: Secondary | ICD-10-CM | POA: Diagnosis not present

## 2017-02-24 DIAGNOSIS — Z6820 Body mass index (BMI) 20.0-20.9, adult: Secondary | ICD-10-CM | POA: Diagnosis not present

## 2017-02-24 DIAGNOSIS — I10 Essential (primary) hypertension: Secondary | ICD-10-CM | POA: Diagnosis not present

## 2017-02-24 DIAGNOSIS — J441 Chronic obstructive pulmonary disease with (acute) exacerbation: Secondary | ICD-10-CM | POA: Diagnosis not present

## 2017-03-18 DIAGNOSIS — I10 Essential (primary) hypertension: Secondary | ICD-10-CM | POA: Diagnosis not present

## 2017-03-18 DIAGNOSIS — M545 Low back pain: Secondary | ICD-10-CM | POA: Diagnosis not present

## 2017-03-18 DIAGNOSIS — J441 Chronic obstructive pulmonary disease with (acute) exacerbation: Secondary | ICD-10-CM | POA: Diagnosis not present

## 2017-05-26 DIAGNOSIS — Z6820 Body mass index (BMI) 20.0-20.9, adult: Secondary | ICD-10-CM | POA: Diagnosis not present

## 2017-05-26 DIAGNOSIS — Z0000 Encounter for general adult medical examination without abnormal findings: Secondary | ICD-10-CM | POA: Diagnosis not present

## 2017-05-26 DIAGNOSIS — I10 Essential (primary) hypertension: Secondary | ICD-10-CM | POA: Diagnosis not present

## 2017-05-26 DIAGNOSIS — Z1389 Encounter for screening for other disorder: Secondary | ICD-10-CM | POA: Diagnosis not present

## 2017-05-26 DIAGNOSIS — M545 Low back pain: Secondary | ICD-10-CM | POA: Diagnosis not present

## 2017-05-26 DIAGNOSIS — J441 Chronic obstructive pulmonary disease with (acute) exacerbation: Secondary | ICD-10-CM | POA: Diagnosis not present

## 2017-06-02 DIAGNOSIS — M545 Low back pain: Secondary | ICD-10-CM | POA: Diagnosis not present

## 2017-06-02 DIAGNOSIS — I10 Essential (primary) hypertension: Secondary | ICD-10-CM | POA: Diagnosis not present

## 2017-06-02 DIAGNOSIS — J441 Chronic obstructive pulmonary disease with (acute) exacerbation: Secondary | ICD-10-CM | POA: Diagnosis not present

## 2017-07-29 DIAGNOSIS — I10 Essential (primary) hypertension: Secondary | ICD-10-CM | POA: Diagnosis not present

## 2017-07-29 DIAGNOSIS — J441 Chronic obstructive pulmonary disease with (acute) exacerbation: Secondary | ICD-10-CM | POA: Diagnosis not present

## 2017-07-29 DIAGNOSIS — M545 Low back pain: Secondary | ICD-10-CM | POA: Diagnosis not present

## 2017-08-16 DIAGNOSIS — M545 Low back pain: Secondary | ICD-10-CM | POA: Diagnosis not present

## 2017-08-16 DIAGNOSIS — J441 Chronic obstructive pulmonary disease with (acute) exacerbation: Secondary | ICD-10-CM | POA: Diagnosis not present

## 2017-08-16 DIAGNOSIS — I10 Essential (primary) hypertension: Secondary | ICD-10-CM | POA: Diagnosis not present

## 2017-08-25 DIAGNOSIS — Z0000 Encounter for general adult medical examination without abnormal findings: Secondary | ICD-10-CM | POA: Diagnosis not present

## 2017-08-25 DIAGNOSIS — Z6822 Body mass index (BMI) 22.0-22.9, adult: Secondary | ICD-10-CM | POA: Diagnosis not present

## 2017-09-14 DIAGNOSIS — J449 Chronic obstructive pulmonary disease, unspecified: Secondary | ICD-10-CM | POA: Diagnosis not present

## 2017-09-14 DIAGNOSIS — F064 Anxiety disorder due to known physiological condition: Secondary | ICD-10-CM | POA: Diagnosis not present

## 2017-09-14 DIAGNOSIS — I10 Essential (primary) hypertension: Secondary | ICD-10-CM | POA: Diagnosis not present

## 2017-10-07 DIAGNOSIS — J449 Chronic obstructive pulmonary disease, unspecified: Secondary | ICD-10-CM | POA: Diagnosis not present

## 2017-10-07 DIAGNOSIS — F064 Anxiety disorder due to known physiological condition: Secondary | ICD-10-CM | POA: Diagnosis not present

## 2017-10-07 DIAGNOSIS — I10 Essential (primary) hypertension: Secondary | ICD-10-CM | POA: Diagnosis not present

## 2017-11-14 DIAGNOSIS — I10 Essential (primary) hypertension: Secondary | ICD-10-CM | POA: Diagnosis not present

## 2017-11-14 DIAGNOSIS — J449 Chronic obstructive pulmonary disease, unspecified: Secondary | ICD-10-CM | POA: Diagnosis not present

## 2017-11-14 DIAGNOSIS — F064 Anxiety disorder due to known physiological condition: Secondary | ICD-10-CM | POA: Diagnosis not present

## 2017-11-24 DIAGNOSIS — J449 Chronic obstructive pulmonary disease, unspecified: Secondary | ICD-10-CM | POA: Diagnosis not present

## 2017-11-24 DIAGNOSIS — Z6823 Body mass index (BMI) 23.0-23.9, adult: Secondary | ICD-10-CM | POA: Diagnosis not present

## 2017-11-24 DIAGNOSIS — F419 Anxiety disorder, unspecified: Secondary | ICD-10-CM | POA: Diagnosis not present

## 2017-11-24 DIAGNOSIS — I10 Essential (primary) hypertension: Secondary | ICD-10-CM | POA: Diagnosis not present

## 2017-11-24 DIAGNOSIS — I739 Peripheral vascular disease, unspecified: Secondary | ICD-10-CM | POA: Diagnosis not present

## 2018-01-17 DIAGNOSIS — I10 Essential (primary) hypertension: Secondary | ICD-10-CM | POA: Diagnosis not present

## 2018-01-17 DIAGNOSIS — F419 Anxiety disorder, unspecified: Secondary | ICD-10-CM | POA: Diagnosis not present

## 2018-01-17 DIAGNOSIS — I739 Peripheral vascular disease, unspecified: Secondary | ICD-10-CM | POA: Diagnosis not present

## 2018-01-17 DIAGNOSIS — J449 Chronic obstructive pulmonary disease, unspecified: Secondary | ICD-10-CM | POA: Diagnosis not present

## 2018-02-15 DIAGNOSIS — J449 Chronic obstructive pulmonary disease, unspecified: Secondary | ICD-10-CM | POA: Diagnosis not present

## 2018-02-15 DIAGNOSIS — F419 Anxiety disorder, unspecified: Secondary | ICD-10-CM | POA: Diagnosis not present

## 2018-02-15 DIAGNOSIS — I739 Peripheral vascular disease, unspecified: Secondary | ICD-10-CM | POA: Diagnosis not present

## 2018-02-15 DIAGNOSIS — I10 Essential (primary) hypertension: Secondary | ICD-10-CM | POA: Diagnosis not present

## 2018-02-23 DIAGNOSIS — R5383 Other fatigue: Secondary | ICD-10-CM | POA: Diagnosis not present

## 2018-02-23 DIAGNOSIS — Z6822 Body mass index (BMI) 22.0-22.9, adult: Secondary | ICD-10-CM | POA: Diagnosis not present

## 2018-02-23 DIAGNOSIS — J449 Chronic obstructive pulmonary disease, unspecified: Secondary | ICD-10-CM | POA: Diagnosis not present

## 2018-02-23 DIAGNOSIS — I739 Peripheral vascular disease, unspecified: Secondary | ICD-10-CM | POA: Diagnosis not present

## 2018-02-23 DIAGNOSIS — I10 Essential (primary) hypertension: Secondary | ICD-10-CM | POA: Diagnosis not present

## 2018-02-23 DIAGNOSIS — F419 Anxiety disorder, unspecified: Secondary | ICD-10-CM | POA: Diagnosis not present

## 2018-05-25 DIAGNOSIS — F419 Anxiety disorder, unspecified: Secondary | ICD-10-CM | POA: Diagnosis not present

## 2018-05-25 DIAGNOSIS — J449 Chronic obstructive pulmonary disease, unspecified: Secondary | ICD-10-CM | POA: Diagnosis not present

## 2018-05-25 DIAGNOSIS — Z6822 Body mass index (BMI) 22.0-22.9, adult: Secondary | ICD-10-CM | POA: Diagnosis not present

## 2018-05-25 DIAGNOSIS — I10 Essential (primary) hypertension: Secondary | ICD-10-CM | POA: Diagnosis not present

## 2018-05-25 DIAGNOSIS — I739 Peripheral vascular disease, unspecified: Secondary | ICD-10-CM | POA: Diagnosis not present

## 2018-05-25 DIAGNOSIS — M545 Low back pain: Secondary | ICD-10-CM | POA: Diagnosis not present

## 2018-06-01 DIAGNOSIS — I10 Essential (primary) hypertension: Secondary | ICD-10-CM | POA: Diagnosis not present

## 2018-06-01 DIAGNOSIS — J449 Chronic obstructive pulmonary disease, unspecified: Secondary | ICD-10-CM | POA: Diagnosis not present

## 2018-06-01 DIAGNOSIS — M545 Low back pain: Secondary | ICD-10-CM | POA: Diagnosis not present

## 2018-06-07 DIAGNOSIS — M545 Low back pain: Secondary | ICD-10-CM | POA: Diagnosis not present

## 2018-06-07 DIAGNOSIS — I10 Essential (primary) hypertension: Secondary | ICD-10-CM | POA: Diagnosis not present

## 2018-06-07 DIAGNOSIS — J449 Chronic obstructive pulmonary disease, unspecified: Secondary | ICD-10-CM | POA: Diagnosis not present

## 2018-07-10 DIAGNOSIS — I10 Essential (primary) hypertension: Secondary | ICD-10-CM | POA: Diagnosis not present

## 2018-07-10 DIAGNOSIS — J449 Chronic obstructive pulmonary disease, unspecified: Secondary | ICD-10-CM | POA: Diagnosis not present

## 2018-07-10 DIAGNOSIS — M545 Low back pain: Secondary | ICD-10-CM | POA: Diagnosis not present

## 2018-08-07 DIAGNOSIS — M545 Low back pain: Secondary | ICD-10-CM | POA: Diagnosis not present

## 2018-08-07 DIAGNOSIS — I10 Essential (primary) hypertension: Secondary | ICD-10-CM | POA: Diagnosis not present

## 2018-08-07 DIAGNOSIS — J449 Chronic obstructive pulmonary disease, unspecified: Secondary | ICD-10-CM | POA: Diagnosis not present

## 2018-08-24 DIAGNOSIS — J449 Chronic obstructive pulmonary disease, unspecified: Secondary | ICD-10-CM | POA: Diagnosis not present

## 2018-08-24 DIAGNOSIS — I10 Essential (primary) hypertension: Secondary | ICD-10-CM | POA: Diagnosis not present

## 2018-08-24 DIAGNOSIS — Z6825 Body mass index (BMI) 25.0-25.9, adult: Secondary | ICD-10-CM | POA: Diagnosis not present

## 2018-08-24 DIAGNOSIS — M545 Low back pain: Secondary | ICD-10-CM | POA: Diagnosis not present

## 2018-08-24 DIAGNOSIS — I739 Peripheral vascular disease, unspecified: Secondary | ICD-10-CM | POA: Diagnosis not present

## 2018-08-24 DIAGNOSIS — Z0000 Encounter for general adult medical examination without abnormal findings: Secondary | ICD-10-CM | POA: Diagnosis not present

## 2018-08-24 DIAGNOSIS — F419 Anxiety disorder, unspecified: Secondary | ICD-10-CM | POA: Diagnosis not present

## 2018-08-24 DIAGNOSIS — Z1389 Encounter for screening for other disorder: Secondary | ICD-10-CM | POA: Diagnosis not present

## 2018-09-06 DIAGNOSIS — I10 Essential (primary) hypertension: Secondary | ICD-10-CM | POA: Diagnosis not present

## 2018-09-06 DIAGNOSIS — M545 Low back pain: Secondary | ICD-10-CM | POA: Diagnosis not present

## 2018-09-06 DIAGNOSIS — J449 Chronic obstructive pulmonary disease, unspecified: Secondary | ICD-10-CM | POA: Diagnosis not present

## 2018-10-05 DIAGNOSIS — J449 Chronic obstructive pulmonary disease, unspecified: Secondary | ICD-10-CM | POA: Diagnosis not present

## 2018-10-05 DIAGNOSIS — I10 Essential (primary) hypertension: Secondary | ICD-10-CM | POA: Diagnosis not present

## 2018-10-05 DIAGNOSIS — M545 Low back pain: Secondary | ICD-10-CM | POA: Diagnosis not present

## 2018-10-17 DIAGNOSIS — H401131 Primary open-angle glaucoma, bilateral, mild stage: Secondary | ICD-10-CM | POA: Diagnosis not present

## 2018-11-06 DIAGNOSIS — M545 Low back pain: Secondary | ICD-10-CM | POA: Diagnosis not present

## 2018-11-06 DIAGNOSIS — I10 Essential (primary) hypertension: Secondary | ICD-10-CM | POA: Diagnosis not present

## 2018-11-06 DIAGNOSIS — J449 Chronic obstructive pulmonary disease, unspecified: Secondary | ICD-10-CM | POA: Diagnosis not present

## 2018-11-16 DIAGNOSIS — H401131 Primary open-angle glaucoma, bilateral, mild stage: Secondary | ICD-10-CM | POA: Diagnosis not present

## 2018-11-27 DIAGNOSIS — Z0000 Encounter for general adult medical examination without abnormal findings: Secondary | ICD-10-CM | POA: Diagnosis not present

## 2018-11-27 DIAGNOSIS — J449 Chronic obstructive pulmonary disease, unspecified: Secondary | ICD-10-CM | POA: Diagnosis not present

## 2018-11-27 DIAGNOSIS — F419 Anxiety disorder, unspecified: Secondary | ICD-10-CM | POA: Diagnosis not present

## 2018-11-27 DIAGNOSIS — Z6826 Body mass index (BMI) 26.0-26.9, adult: Secondary | ICD-10-CM | POA: Diagnosis not present

## 2018-11-27 DIAGNOSIS — I739 Peripheral vascular disease, unspecified: Secondary | ICD-10-CM | POA: Diagnosis not present

## 2018-11-27 DIAGNOSIS — I10 Essential (primary) hypertension: Secondary | ICD-10-CM | POA: Diagnosis not present

## 2018-11-27 DIAGNOSIS — M545 Low back pain: Secondary | ICD-10-CM | POA: Diagnosis not present

## 2018-12-06 DIAGNOSIS — M545 Low back pain: Secondary | ICD-10-CM | POA: Diagnosis not present

## 2018-12-06 DIAGNOSIS — F419 Anxiety disorder, unspecified: Secondary | ICD-10-CM | POA: Diagnosis not present

## 2018-12-06 DIAGNOSIS — J449 Chronic obstructive pulmonary disease, unspecified: Secondary | ICD-10-CM | POA: Diagnosis not present

## 2018-12-06 DIAGNOSIS — I10 Essential (primary) hypertension: Secondary | ICD-10-CM | POA: Diagnosis not present

## 2018-12-14 DIAGNOSIS — H524 Presbyopia: Secondary | ICD-10-CM | POA: Diagnosis not present

## 2019-01-31 DIAGNOSIS — M545 Low back pain: Secondary | ICD-10-CM | POA: Diagnosis not present

## 2019-01-31 DIAGNOSIS — J449 Chronic obstructive pulmonary disease, unspecified: Secondary | ICD-10-CM | POA: Diagnosis not present

## 2019-01-31 DIAGNOSIS — I10 Essential (primary) hypertension: Secondary | ICD-10-CM | POA: Diagnosis not present

## 2019-01-31 DIAGNOSIS — F419 Anxiety disorder, unspecified: Secondary | ICD-10-CM | POA: Diagnosis not present

## 2019-02-27 DIAGNOSIS — Z0000 Encounter for general adult medical examination without abnormal findings: Secondary | ICD-10-CM | POA: Diagnosis not present

## 2019-02-27 DIAGNOSIS — I10 Essential (primary) hypertension: Secondary | ICD-10-CM | POA: Diagnosis not present

## 2019-02-27 DIAGNOSIS — M545 Low back pain: Secondary | ICD-10-CM | POA: Diagnosis not present

## 2019-02-27 DIAGNOSIS — J449 Chronic obstructive pulmonary disease, unspecified: Secondary | ICD-10-CM | POA: Diagnosis not present

## 2019-02-27 DIAGNOSIS — F419 Anxiety disorder, unspecified: Secondary | ICD-10-CM | POA: Diagnosis not present

## 2019-02-27 DIAGNOSIS — Z6827 Body mass index (BMI) 27.0-27.9, adult: Secondary | ICD-10-CM | POA: Diagnosis not present

## 2019-02-27 DIAGNOSIS — I739 Peripheral vascular disease, unspecified: Secondary | ICD-10-CM | POA: Diagnosis not present

## 2019-03-02 DIAGNOSIS — J449 Chronic obstructive pulmonary disease, unspecified: Secondary | ICD-10-CM | POA: Diagnosis not present

## 2019-03-02 DIAGNOSIS — M545 Low back pain: Secondary | ICD-10-CM | POA: Diagnosis not present

## 2019-03-02 DIAGNOSIS — I10 Essential (primary) hypertension: Secondary | ICD-10-CM | POA: Diagnosis not present

## 2019-03-02 DIAGNOSIS — F419 Anxiety disorder, unspecified: Secondary | ICD-10-CM | POA: Diagnosis not present

## 2019-03-10 ENCOUNTER — Ambulatory Visit: Payer: Medicare HMO | Attending: Internal Medicine

## 2019-03-10 ENCOUNTER — Other Ambulatory Visit: Payer: Self-pay

## 2019-03-10 DIAGNOSIS — Z23 Encounter for immunization: Secondary | ICD-10-CM

## 2019-03-10 NOTE — Progress Notes (Signed)
   Covid-19 Vaccination Clinic  Name:  Tina Tucker    MRN: [id number] DOB: 11-15-1932  03/10/2019  [name] was observed post Covid-19 immunization for 15 minutes without incident. She was provided with Vaccine Information Sheet and instruction to access the V-Safe system.   [name] was instructed to call 911 with any severe reactions post vaccine: Marland Kitchen Difficulty breathing  . Swelling of face and throat  . A fast heartbeat  . A bad rash all over body  . Dizziness and weakness   Immunizations Administered    Name Date Dose VIS Date Route   Moderna COVID-19 Vaccine 03/10/2019 11:15 AM 0.5 mL 12/05/2018 Intramuscular   Manufacturer: Moderna   Lot: [id number]   [id number]

## 2019-04-02 DIAGNOSIS — F419 Anxiety disorder, unspecified: Secondary | ICD-10-CM | POA: Diagnosis not present

## 2019-04-02 DIAGNOSIS — J449 Chronic obstructive pulmonary disease, unspecified: Secondary | ICD-10-CM | POA: Diagnosis not present

## 2019-04-02 DIAGNOSIS — M545 Low back pain: Secondary | ICD-10-CM | POA: Diagnosis not present

## 2019-04-02 DIAGNOSIS — I10 Essential (primary) hypertension: Secondary | ICD-10-CM | POA: Diagnosis not present

## 2019-04-11 ENCOUNTER — Ambulatory Visit: Payer: Medicare HMO | Attending: Internal Medicine

## 2019-04-11 DIAGNOSIS — Z23 Encounter for immunization: Secondary | ICD-10-CM

## 2019-04-11 NOTE — Progress Notes (Signed)
   Covid-19 Vaccination Clinic  Name:  BURNICE KURZAWA    MRN: [id number] DOB: 1932-05-02  04/11/2019  Ms. Parron was observed post Covid-19 immunization for 15 minutes without incident. She was provided with Vaccine Information Sheet and instruction to access the V-Safe system.   Ms. Sehnert was instructed to call 911 with any severe reactions post vaccine: Marland Kitchen Difficulty breathing  . Swelling of face and throat  . A fast heartbeat  . A bad rash all over body  . Dizziness and weakness   Immunizations Administered    Name Date Dose VIS Date Route   Moderna COVID-19 Vaccine 04/11/2019  2:47 PM 0.5 mL 12/05/2018 Intramuscular   Manufacturer: Levan Hurst   LotUD:[id number]   [place][id number]

## 2019-04-25 DIAGNOSIS — F419 Anxiety disorder, unspecified: Secondary | ICD-10-CM | POA: Diagnosis not present

## 2019-04-25 DIAGNOSIS — J449 Chronic obstructive pulmonary disease, unspecified: Secondary | ICD-10-CM | POA: Diagnosis not present

## 2019-04-25 DIAGNOSIS — M545 Low back pain: Secondary | ICD-10-CM | POA: Diagnosis not present

## 2019-04-25 DIAGNOSIS — I10 Essential (primary) hypertension: Secondary | ICD-10-CM | POA: Diagnosis not present

## 2019-05-17 DIAGNOSIS — F419 Anxiety disorder, unspecified: Secondary | ICD-10-CM | POA: Diagnosis not present

## 2019-05-17 DIAGNOSIS — M545 Low back pain: Secondary | ICD-10-CM | POA: Diagnosis not present

## 2019-05-17 DIAGNOSIS — I10 Essential (primary) hypertension: Secondary | ICD-10-CM | POA: Diagnosis not present

## 2019-05-17 DIAGNOSIS — J449 Chronic obstructive pulmonary disease, unspecified: Secondary | ICD-10-CM | POA: Diagnosis not present

## 2019-05-28 DIAGNOSIS — M545 Low back pain: Secondary | ICD-10-CM | POA: Diagnosis not present

## 2019-05-28 DIAGNOSIS — I739 Peripheral vascular disease, unspecified: Secondary | ICD-10-CM | POA: Diagnosis not present

## 2019-05-28 DIAGNOSIS — F419 Anxiety disorder, unspecified: Secondary | ICD-10-CM | POA: Diagnosis not present

## 2019-05-28 DIAGNOSIS — Z6825 Body mass index (BMI) 25.0-25.9, adult: Secondary | ICD-10-CM | POA: Diagnosis not present

## 2019-05-28 DIAGNOSIS — I10 Essential (primary) hypertension: Secondary | ICD-10-CM | POA: Diagnosis not present

## 2019-05-28 DIAGNOSIS — J449 Chronic obstructive pulmonary disease, unspecified: Secondary | ICD-10-CM | POA: Diagnosis not present

## 2019-06-15 DIAGNOSIS — M545 Low back pain: Secondary | ICD-10-CM | POA: Diagnosis not present

## 2019-06-15 DIAGNOSIS — F419 Anxiety disorder, unspecified: Secondary | ICD-10-CM | POA: Diagnosis not present

## 2019-06-15 DIAGNOSIS — I739 Peripheral vascular disease, unspecified: Secondary | ICD-10-CM | POA: Diagnosis not present

## 2019-06-15 DIAGNOSIS — I10 Essential (primary) hypertension: Secondary | ICD-10-CM | POA: Diagnosis not present

## 2019-06-15 DIAGNOSIS — J449 Chronic obstructive pulmonary disease, unspecified: Secondary | ICD-10-CM | POA: Diagnosis not present

## 2019-07-17 DIAGNOSIS — I10 Essential (primary) hypertension: Secondary | ICD-10-CM | POA: Diagnosis not present

## 2019-07-17 DIAGNOSIS — J449 Chronic obstructive pulmonary disease, unspecified: Secondary | ICD-10-CM | POA: Diagnosis not present

## 2019-07-17 DIAGNOSIS — F419 Anxiety disorder, unspecified: Secondary | ICD-10-CM | POA: Diagnosis not present

## 2019-07-17 DIAGNOSIS — I739 Peripheral vascular disease, unspecified: Secondary | ICD-10-CM | POA: Diagnosis not present

## 2019-07-17 DIAGNOSIS — M545 Low back pain: Secondary | ICD-10-CM | POA: Diagnosis not present

## 2019-08-28 DIAGNOSIS — Z1331 Encounter for screening for depression: Secondary | ICD-10-CM | POA: Diagnosis not present

## 2019-08-28 DIAGNOSIS — F419 Anxiety disorder, unspecified: Secondary | ICD-10-CM | POA: Diagnosis not present

## 2019-08-28 DIAGNOSIS — Z0000 Encounter for general adult medical examination without abnormal findings: Secondary | ICD-10-CM | POA: Diagnosis not present

## 2019-08-28 DIAGNOSIS — Z6825 Body mass index (BMI) 25.0-25.9, adult: Secondary | ICD-10-CM | POA: Diagnosis not present

## 2019-08-28 DIAGNOSIS — J449 Chronic obstructive pulmonary disease, unspecified: Secondary | ICD-10-CM | POA: Diagnosis not present

## 2019-08-28 DIAGNOSIS — I10 Essential (primary) hypertension: Secondary | ICD-10-CM | POA: Diagnosis not present

## 2019-08-28 DIAGNOSIS — I739 Peripheral vascular disease, unspecified: Secondary | ICD-10-CM | POA: Diagnosis not present

## 2019-08-28 DIAGNOSIS — M545 Low back pain: Secondary | ICD-10-CM | POA: Diagnosis not present

## 2019-08-30 DIAGNOSIS — F419 Anxiety disorder, unspecified: Secondary | ICD-10-CM | POA: Diagnosis not present

## 2019-08-30 DIAGNOSIS — I10 Essential (primary) hypertension: Secondary | ICD-10-CM | POA: Diagnosis not present

## 2019-08-30 DIAGNOSIS — J449 Chronic obstructive pulmonary disease, unspecified: Secondary | ICD-10-CM | POA: Diagnosis not present

## 2019-08-30 DIAGNOSIS — M545 Low back pain: Secondary | ICD-10-CM | POA: Diagnosis not present

## 2019-08-31 DIAGNOSIS — E119 Type 2 diabetes mellitus without complications: Secondary | ICD-10-CM | POA: Diagnosis not present

## 2019-08-31 DIAGNOSIS — I10 Essential (primary) hypertension: Secondary | ICD-10-CM | POA: Diagnosis not present

## 2019-09-03 DIAGNOSIS — M545 Low back pain: Secondary | ICD-10-CM | POA: Diagnosis not present

## 2019-09-03 DIAGNOSIS — I739 Peripheral vascular disease, unspecified: Secondary | ICD-10-CM | POA: Diagnosis not present

## 2019-09-03 DIAGNOSIS — J449 Chronic obstructive pulmonary disease, unspecified: Secondary | ICD-10-CM | POA: Diagnosis not present

## 2019-09-03 DIAGNOSIS — F419 Anxiety disorder, unspecified: Secondary | ICD-10-CM | POA: Diagnosis not present

## 2019-09-03 DIAGNOSIS — Z0000 Encounter for general adult medical examination without abnormal findings: Secondary | ICD-10-CM | POA: Diagnosis not present

## 2019-09-03 DIAGNOSIS — Z1331 Encounter for screening for depression: Secondary | ICD-10-CM | POA: Diagnosis not present

## 2019-09-03 DIAGNOSIS — I10 Essential (primary) hypertension: Secondary | ICD-10-CM | POA: Diagnosis not present

## 2019-10-30 DIAGNOSIS — I771 Stricture of artery: Secondary | ICD-10-CM | POA: Diagnosis not present

## 2019-10-30 DIAGNOSIS — K5730 Diverticulosis of large intestine without perforation or abscess without bleeding: Secondary | ICD-10-CM | POA: Diagnosis not present

## 2019-10-30 DIAGNOSIS — I701 Atherosclerosis of renal artery: Secondary | ICD-10-CM | POA: Diagnosis not present

## 2019-10-30 DIAGNOSIS — I708 Atherosclerosis of other arteries: Secondary | ICD-10-CM | POA: Diagnosis not present

## 2019-10-30 DIAGNOSIS — K625 Hemorrhage of anus and rectum: Secondary | ICD-10-CM | POA: Diagnosis not present

## 2019-10-30 DIAGNOSIS — F17200 Nicotine dependence, unspecified, uncomplicated: Secondary | ICD-10-CM | POA: Diagnosis not present

## 2019-10-30 DIAGNOSIS — J449 Chronic obstructive pulmonary disease, unspecified: Secondary | ICD-10-CM | POA: Diagnosis not present

## 2019-10-30 DIAGNOSIS — I774 Celiac artery compression syndrome: Secondary | ICD-10-CM | POA: Diagnosis not present

## 2019-10-30 DIAGNOSIS — K8020 Calculus of gallbladder without cholecystitis without obstruction: Secondary | ICD-10-CM | POA: Diagnosis not present

## 2019-10-30 DIAGNOSIS — I700 Atherosclerosis of aorta: Secondary | ICD-10-CM | POA: Diagnosis not present

## 2019-11-28 DIAGNOSIS — Z0000 Encounter for general adult medical examination without abnormal findings: Secondary | ICD-10-CM | POA: Diagnosis not present

## 2019-11-28 DIAGNOSIS — K921 Melena: Secondary | ICD-10-CM | POA: Diagnosis not present

## 2019-11-28 DIAGNOSIS — Z6825 Body mass index (BMI) 25.0-25.9, adult: Secondary | ICD-10-CM | POA: Diagnosis not present

## 2019-11-28 DIAGNOSIS — I872 Venous insufficiency (chronic) (peripheral): Secondary | ICD-10-CM | POA: Diagnosis not present

## 2019-11-30 DIAGNOSIS — R0602 Shortness of breath: Secondary | ICD-10-CM | POA: Diagnosis not present

## 2019-12-05 DIAGNOSIS — J449 Chronic obstructive pulmonary disease, unspecified: Secondary | ICD-10-CM | POA: Diagnosis not present

## 2019-12-05 DIAGNOSIS — I10 Essential (primary) hypertension: Secondary | ICD-10-CM | POA: Diagnosis not present

## 2019-12-31 DIAGNOSIS — S91 Unspecified open wound of ankle: Secondary | ICD-10-CM | POA: Diagnosis not present

## 2020-01-11 DIAGNOSIS — L97319 Non-pressure chronic ulcer of right ankle with unspecified severity: Secondary | ICD-10-CM | POA: Diagnosis not present

## 2020-01-11 DIAGNOSIS — T1490XD Injury, unspecified, subsequent encounter: Secondary | ICD-10-CM | POA: Diagnosis not present

## 2020-01-14 DIAGNOSIS — L8992 Pressure ulcer of unspecified site, stage 2: Secondary | ICD-10-CM | POA: Diagnosis not present

## 2020-01-14 DIAGNOSIS — Z6826 Body mass index (BMI) 26.0-26.9, adult: Secondary | ICD-10-CM | POA: Diagnosis not present

## 2020-02-02 DIAGNOSIS — J441 Chronic obstructive pulmonary disease with (acute) exacerbation: Secondary | ICD-10-CM | POA: Diagnosis not present

## 2020-02-02 DIAGNOSIS — I10 Essential (primary) hypertension: Secondary | ICD-10-CM | POA: Diagnosis not present

## 2020-02-27 DIAGNOSIS — I10 Essential (primary) hypertension: Secondary | ICD-10-CM | POA: Diagnosis not present

## 2020-02-27 DIAGNOSIS — L723 Sebaceous cyst: Secondary | ICD-10-CM | POA: Diagnosis not present

## 2020-02-27 DIAGNOSIS — Z6826 Body mass index (BMI) 26.0-26.9, adult: Secondary | ICD-10-CM | POA: Diagnosis not present

## 2020-02-27 DIAGNOSIS — I872 Venous insufficiency (chronic) (peripheral): Secondary | ICD-10-CM | POA: Diagnosis not present

## 2020-03-05 ENCOUNTER — Ambulatory Visit (HOSPITAL_COMMUNITY): Payer: Medicare HMO | Attending: Internal Medicine | Admitting: Physical Therapy

## 2020-03-05 ENCOUNTER — Encounter (HOSPITAL_COMMUNITY): Payer: Self-pay | Admitting: Physical Therapy

## 2020-03-05 ENCOUNTER — Other Ambulatory Visit: Payer: Self-pay

## 2020-03-05 DIAGNOSIS — L97319 Non-pressure chronic ulcer of right ankle with unspecified severity: Secondary | ICD-10-CM | POA: Insufficient documentation

## 2020-03-05 DIAGNOSIS — R262 Difficulty in walking, not elsewhere classified: Secondary | ICD-10-CM | POA: Diagnosis not present

## 2020-03-05 DIAGNOSIS — M79661 Pain in right lower leg: Secondary | ICD-10-CM | POA: Insufficient documentation

## 2020-03-05 NOTE — Therapy (Signed)
[place], [place], [address] Phone: [phone]   Fax:  [phone]  Wound Care Evaluation  Patient Details  Name: Tina Tucker MRN: [id number] Date of Birth: 1932/05/27 Referring Provider (PT): Wyn Forster   Encounter Date: 03/05/2020   PT End of Session - 03/05/20 1626    Visit Number 1    Number of Visits 12    Date for PT Re-Evaluation 04/16/20    Authorization Type [employer]    PT Start Time 6433    PT Stop Time 1610    PT Time Calculation (min) 40 min    Activity Tolerance Patient limited by pain    Behavior During Therapy [facility] for tasks assessed/performed           History reviewed. No pertinent past medical history.  History reviewed. No pertinent surgical history.  There were no vitals filed for this visit.     [facility] PT Assessment - 03/05/20 0001      Assessment   Medical Diagnosis non Healing right ankle wound    Referring Provider (PT) xjae Hasanaj    Onset Date/Surgical Date 11/18/19    Next MD Visit not scheduled    Prior Therapy none      Precautions   Precautions --   cellulitis     Restrictions   Weight Bearing Restrictions No      Balance Screen   Has the patient fallen in the past 6 months No    Has the patient had a decrease in activity level because of a fear of falling?  Yes    Is the patient reluctant to leave their home because of a fear of falling?  No           Wound Therapy - 03/05/20 0001    Subjective PT states that she is not sure what happened.  In November she noted that her socks and pants were getting wet, she went to her daughter who told her that her leg was weeping and she had a sore on the inside of her ankle.  Over the last several months she has been to the MD multiple times and has been on antibiotics without resolution.  Her daughter has been cleansing the wound with soap and water as directed but she states that there has been no change in the wound.     Patient and Family Stated Goals wound to heal.    Date of Onset 11/18/19    Prior Treatments self care, MD and antibiotics    Pain Scale 0-10    Pain Score 8     Pain Type Chronic pain    Pain Location Ankle    Pain Orientation Right;Medial    Pain Descriptors / Indicators Aching    Pain Onset Progressive    Patients Stated Pain Goal 0    Pain Intervention(s) Emotional support    Evaluation and Treatment Procedures Explained to Patient/Family Yes    Evaluation and Treatment Procedures agreed to    Wound Properties Date First Assessed: 03/05/20 Time First Assessed: 1540 Location: Ankle Location Orientation: Right;Medial Wound Description (Comments): circular in nature with yellow eschar; LE in slightly indurated Present on Admission: Yes   Wound Image View All Images View Images    Dressing Type None    Dressing Change Frequency PRN    Site / Wound Assessment Painful;Yellow    % Wound base Red or Granulating 10%    % Wound base Yellow/Fibrinous  Exudate 90%    Peri-wound Assessment Edema;Erythema (blanchable)    Wound Length (cm) 1 cm    Wound Width (cm) 1.2 cm    Wound Depth (cm) --   unknown   Wound Surface Area (cm^2) 1.2 cm^2    Drainage Amount Scant    Drainage Description Serous    Treatment Cleansed;Debridement (Selective)    Selective Debridement - Location wound bed    Selective Debridement - Tools Used Forceps;Scalpel;Other (comment)    Selective Debridement - Tissue Removed slough    Wound Therapy - Clinical Statement Tina Tucker is an 87 yo female who has been referred to this clinic for a non healing wound.  The wound is located on the medial aspect of her Rt ankle, is painful and circular in nature.  The leg itself has noted edema and varicosities.  Tina Tucker most likely has mixed venous/arterial insufficency.  The wound is covered with slough at this point and has a 2 cm ring of redness around it.  She has already been on antibiotics and the MD is weary to try  anymore due to antibiotic resistance.  Tina Tucker will benefit from skilled PT for debridement of the wound followed by profore lite compression to decrease her edema.  She has been measured and the therapist requested that she obtaind compression garments as her Lt LE is slightly indurated as well.    Wound Therapy - Functional Problem List difficulty putting a sock on, pain upon ambulation.    Factors Delaying/Impairing Wound Healing Infection - systemic/local;Multiple medical problems;Vascular compromise;Tobacco use    Hydrotherapy Plan Debridement;Dressing change;Patient/family education;Pulsatile lavage with suction    Wound Therapy - Frequency 2X / week    Wound Therapy - Current Recommendations PT    Wound Plan debridement, dressing change.  May use pulse lavage if debridment is to painful for pt.    Dressing  medihoney, 2x2 , profore lite    Manual Therapy manual techniques to decrease swelling                 Objective measurements completed on examination: See above findings.             PT Education - 03/05/20 1625    Education Details keep profore lite on unless it gets wet or increases pt pain.    Person(s) Educated Patient;Child(ren)    Methods Explanation    Comprehension Verbalized understanding            PT Short Term Goals - 03/05/20 1609      PT SHORT TERM GOAL #1   Title Pt wound to be 100% granulated to prevent infection.    Time 3    Period Weeks    Status New    Target Date 03/26/20      PT SHORT TERM GOAL #2   Title PT pain to be no greater than a 5/10 in her Rt ankle to allow pt to ambulate without pain    Time 3    Period Weeks    Status New             PT Long Term Goals - 03/05/20 1618      PT LONG TERM GOAL #1   Title Rt ankle wound to be healed    Time 6    Period Weeks    Status New    Target Date 04/16/20      PT LONG TERM GOAL #2   Title PT to have no pain  in her Rt ankle when walking.    Time 6    Period  Weeks    Status New      PT LONG TERM GOAL #3   Title PT to have obtained and daughter to be able to [facility] and Doff compression garments to prevent future wounds.    Time 6    Period Weeks    Status New                Plan - 03/05/20 1627    Clinical Impression Statement see above    Personal Factors and Comorbidities Age;Comorbidity 3+;Fitness;Time since onset of injury/illness/exacerbation    Comorbidities COPD, CAD, THRx 2; varicosities    Examination-Activity Limitations Bathing;Dressing;Locomotion Level    Examination-Participation Restrictions Other    Stability/Clinical Decision Making Evolving/Moderate complexity    Clinical Decision Making Moderate    Rehab Potential Good    PT Frequency 2x / week    PT Duration 6 weeks    PT Treatment/Interventions Other (comment);Compression bandaging;Patient/family education   debridement,   PT Next Visit Plan continue debridment and dreassing change as indicated.           Patient will benefit from skilled therapeutic intervention in order to improve the following deficits and impairments:  Pain,Difficulty walking,Decreased skin integrity  Visit Diagnosis: Chronic ulcer of right ankle, unspecified ulcer stage (HCC)  Pain in right lower leg  Difficulty in walking, not elsewhere classified    Problem List Patient Active Problem List   Diagnosis Date Noted  . ATHEROSLERO NATV ART EXTREM W/INTERMIT CLAUDICAT 06/17/2009  Rayetta Humphrey, PT CLT [phone] 03/05/2020, 4:52 PM  [facility] [address] [place], [place], [address] Phone: [phone]   Fax:  [phone]  Name: Tina Tucker MRN: [id number] Date of Birth: 1932-03-27

## 2020-03-06 ENCOUNTER — Ambulatory Visit (HOSPITAL_COMMUNITY): Payer: Medicare HMO | Admitting: Physical Therapy

## 2020-03-06 ENCOUNTER — Encounter (HOSPITAL_COMMUNITY): Payer: Self-pay | Admitting: Physical Therapy

## 2020-03-06 DIAGNOSIS — M79661 Pain in right lower leg: Secondary | ICD-10-CM | POA: Diagnosis not present

## 2020-03-06 DIAGNOSIS — R262 Difficulty in walking, not elsewhere classified: Secondary | ICD-10-CM

## 2020-03-06 DIAGNOSIS — L97319 Non-pressure chronic ulcer of right ankle with unspecified severity: Secondary | ICD-10-CM

## 2020-03-06 NOTE — Therapy (Signed)
[place], [place], [address] Phone: [phone]   Fax:  [phone]  Wound Care Therapy  Patient Details  Name: Tina Tucker MRN: [id number] Date of Birth: February 08, 1932 Referring Provider (PT): xjae Hasanaj   Encounter Date: 03/06/2020   PT End of Session - 03/06/20 1440    Visit Number 2    Number of Visits 12    Date for PT Re-Evaluation 04/16/20    Authorization Type [employer]    PT Start Time 1400    PT Stop Time 1435    PT Time Calculation (min) 35 min    Activity Tolerance Patient limited by pain    Behavior During Therapy [facility] for tasks assessed/performed           History reviewed. No pertinent past medical history.  History reviewed. No pertinent surgical history.  There were no vitals filed for this visit.               Wound Therapy - 03/06/20 0001    Subjective Patient states that she had some burning in her wound. The compression wrap felt fine.    Patient and Family Stated Goals wound to heal.    Date of Onset 11/18/19    Prior Treatments self care, MD and antibiotics    Pain Scale Faces    Faces Pain Scale Hurts even more    Pain Type Chronic pain    Pain Location Ankle    Pain Orientation Right;Medial    Pain Descriptors / Indicators Aching    Pain Onset Progressive    Patients Stated Pain Goal 0    Pain Intervention(s) Emotional support    Evaluation and Treatment Procedures Explained to Patient/Family Yes    Evaluation and Treatment Procedures agreed to    Wound Properties Date First Assessed: 03/05/20 Time First Assessed: 1540 Location: Ankle Location Orientation: Right;Medial Wound Description (Comments): circular in nature with yellow eschar; LE in slightly indurated Present on Admission: Yes   Dressing Type Compression wrap   medihoney   Dressing Changed Changed    Dressing Status Dry;Intact    Dressing Change Frequency PRN    Site / Wound Assessment  Yellow;Painful;Granulation tissue;Red    % Wound base Red or Granulating 10%    % Wound base Yellow/Fibrinous Exudate 90%    Peri-wound Assessment Erythema (blanchable)    Margins Epibole (rolled edges)    Drainage Amount None    Treatment Cleansed;Debridement (Selective)    Selective Debridement - Location wound bed    Selective Debridement - Tools Used Forceps;Scalpel    Selective Debridement - Tissue Removed slough    Wound Therapy - Clinical Statement Patient without drainage from wound on dressing from last session. Patient tolerated compression well and LE edema appears to be improving. Patient with red periwound surrounding entire wound. Patient continues to have very adherent slough in wound bed. Patient with very painful debridement cleaning up wound bed and edges. Continued with medihoney to wound bed with 2x2 and profore light.    Wound Therapy - Functional Problem List difficulty putting a sock on, pain upon ambulation.    Factors Delaying/Impairing Wound Healing Infection - systemic/local;Multiple medical problems;Vascular compromise;Tobacco use    Hydrotherapy Plan Debridement;Dressing change;Patient/family education;Pulsatile lavage with suction    Wound Therapy - Frequency 2X / week    Wound Therapy - Current Recommendations PT    Wound Plan debridement, dressing change.  May use pulse lavage if debridment is to painful  for pt.    Dressing  medihoney, 2x2 , profore lite    Manual Therapy manual techniques to decrease swelling                     PT Short Term Goals - 03/05/20 1609      PT SHORT TERM GOAL #1   Title Pt wound to be 100% granulated to prevent infection.    Time 3    Period Weeks    Status New    Target Date 03/26/20      PT SHORT TERM GOAL #2   Title PT pain to be no greater than a 5/10 in her Rt ankle to allow pt to ambulate without pain    Time 3    Period Weeks    Status New             PT Long Term Goals - 03/05/20 1618      PT  LONG TERM GOAL #1   Title Rt ankle wound to be healed    Time 6    Period Weeks    Status New    Target Date 04/16/20      PT LONG TERM GOAL #2   Title PT to have no pain in her Rt ankle when walking.    Time 6    Period Weeks    Status New      PT LONG TERM GOAL #3   Title PT to have obtained and daughter to be able to [facility] and Doff compression garments to prevent future wounds.    Time 6    Period Weeks    Status New                 Plan - 03/06/20 1440    Clinical Impression Statement see above    Personal Factors and Comorbidities Age;Comorbidity 3+;Fitness;Time since onset of injury/illness/exacerbation    Comorbidities COPD, CAD, THRx 2; varicosities    Examination-Activity Limitations Bathing;Dressing;Locomotion Level    Examination-Participation Restrictions Other    Stability/Clinical Decision Making Evolving/Moderate complexity    Rehab Potential Good    PT Frequency 2x / week    PT Duration 6 weeks    PT Treatment/Interventions Other (comment);Compression bandaging;Patient/family education   debridement,   PT Next Visit Plan continue debridment and dreassing change as indicated.           Patient will benefit from skilled therapeutic intervention in order to improve the following deficits and impairments:  Pain,Difficulty walking,Decreased skin integrity  Visit Diagnosis: Chronic ulcer of right ankle, unspecified ulcer stage (HCC)  Pain in right lower leg  Difficulty in walking, not elsewhere classified     Problem List Patient Active Problem List   Diagnosis Date Noted  . ATHEROSLERO NATV ART EXTREM W/INTERMIT CLAUDICAT 06/17/2009    2:48 PM, 03/06/20 [name] PT, DPT Physical Therapist at [place], [place], [address] Phone: [phone]   Fax:  [phone]  Name: Tina Tucker MRN: [id number] Date of Birth:  1932-12-20

## 2020-03-10 ENCOUNTER — Ambulatory Visit (HOSPITAL_COMMUNITY): Payer: Medicare HMO | Admitting: Physical Therapy

## 2020-03-10 ENCOUNTER — Other Ambulatory Visit: Payer: Self-pay

## 2020-03-10 DIAGNOSIS — R262 Difficulty in walking, not elsewhere classified: Secondary | ICD-10-CM | POA: Diagnosis not present

## 2020-03-10 DIAGNOSIS — L97319 Non-pressure chronic ulcer of right ankle with unspecified severity: Secondary | ICD-10-CM

## 2020-03-10 DIAGNOSIS — M79661 Pain in right lower leg: Secondary | ICD-10-CM | POA: Diagnosis not present

## 2020-03-10 NOTE — Therapy (Signed)
[place], [place], [address] Phone: [phone]   Fax:  [phone]  Wound Care Therapy  Patient Details  Name: Tina Tucker MRN: [id number] Date of Birth: 20-Oct-1932 Referring Provider (PT): Wyn Forster   Encounter Date: 03/10/2020   PT End of Session - 03/10/20 1303    Visit Number 3    Number of Visits 12    Date for PT Re-Evaluation 04/16/20    Authorization Type [employer]    PT Start Time 1130    PT Stop Time 1205    PT Time Calculation (min) 35 min    Activity Tolerance Patient limited by pain    Behavior During Therapy [facility] for tasks assessed/performed           No past medical history on file.  No past surgical history on file.  There were no vitals filed for this visit.               Wound Therapy - 03/10/20 1254    Subjective pt states it got kinda itchy around her wound, otherwise no problems.    Patient and Family Stated Goals wound to heal.    Date of Onset 11/18/19    Prior Treatments self care, MD and antibiotics    Pain Scale Faces    Faces Pain Scale Hurts little more    Pain Type Chronic pain    Pain Location Ankle    Pain Orientation Right;Medial    Pain Descriptors / Indicators Aching    Patients Stated Pain Goal 0    Evaluation and Treatment Procedures Explained to Patient/Family Yes    Evaluation and Treatment Procedures agreed to    Wound Properties Date First Assessed: 03/05/20 Time First Assessed: 1540 Location: Ankle Location Orientation: Right;Medial Wound Description (Comments): circular in nature with yellow eschar; LE in slightly indurated Present on Admission: Yes   Dressing Type Compression wrap   medihoney   Dressing Changed Changed    Dressing Status Dry;Intact    Dressing Change Frequency PRN    Site / Wound Assessment Yellow;Painful    % Wound base Red or Granulating 10%    % Wound base Yellow/Fibrinous Exudate 90%    Peri-wound Assessment  Erythema (blanchable)   1cm perimeter (marked with marker)   Margins Epibole (rolled edges)    Drainage Amount Minimal    Drainage Description Serous    Selective Debridement - Location wound bed    Selective Debridement - Tools Used Forceps;Scalpel    Selective Debridement - Tissue Removed slough    Wound Therapy - Clinical Statement yellow fibrous tissue remains adherent and pt with sensitivities to debridement.  Able to shave most of the edges with scapel to promote approximation, however unable to produce any increase in granulation.  Cleansed wound and remaining LE well, moisurized prior to rebandaging.  Continues to have 1cm halo of redness perimeter of wound that was marked with marker for reference.  will continue to monitor this and need for antibiotic.  discussed sleeping position with patient and was determined she sleeps on her Lt side.  Lack of healing may be due to possible pressure placed on medial malleoli when sleeping.  Cut a small piece of 1/2" foam with hollowed middle to help reduce pressure on wound.  Also recommended placing pillow between legs when sleeping to reduce pressure.  Changed dressing tomedihoney hydrogel to help loosen up devitalized tissue better.    Wound Therapy - Functional Problem  List difficulty putting a sock on, pain upon ambulation.    Factors Delaying/Impairing Wound Healing Infection - systemic/local;Multiple medical problems;Vascular compromise;Tobacco use    Hydrotherapy Plan Debridement;Dressing change;Patient/family education;Pulsatile lavage with suction    Wound Therapy - Frequency 2X / week    Wound Therapy - Current Recommendations PT    Wound Plan debridement, dressing change.  May use pulse lavage if debridment is to painful for pt.  Follow up on sleeping using pillow, check redness and need for antibiotic.    Dressing  medihoney hydrogel, 2x2 , 1/2" foam disc,  profore lite                     PT Short Term Goals - 03/05/20 1609       PT SHORT TERM GOAL #1   Title Pt wound to be 100% granulated to prevent infection.    Time 3    Period Weeks    Status New    Target Date 03/26/20      PT SHORT TERM GOAL #2   Title PT pain to be no greater than a 5/10 in her Rt ankle to allow pt to ambulate without pain    Time 3    Period Weeks    Status New             PT Long Term Goals - 03/05/20 1618      PT LONG TERM GOAL #1   Title Rt ankle wound to be healed    Time 6    Period Weeks    Status New    Target Date 04/16/20      PT LONG TERM GOAL #2   Title PT to have no pain in her Rt ankle when walking.    Time 6    Period Weeks    Status New      PT LONG TERM GOAL #3   Title PT to have obtained and daughter to be able to [facility] and Doff compression garments to prevent future wounds.    Time 6    Period Weeks    Status New                  Patient will benefit from skilled therapeutic intervention in order to improve the following deficits and impairments:     Visit Diagnosis: Pain in right lower leg  Difficulty in walking, not elsewhere classified  Chronic ulcer of right ankle, unspecified ulcer stage [facility])     Problem List Patient Active Problem List   Diagnosis Date Noted  . ATHEROSLERO NATV ART EXTREM W/INTERMIT CLAUDICAT 06/17/2009   Teena Irani, PTA/CLT [phone]  Teena Irani 03/10/2020, 1:04 PM  [facility] [address] [place], [place], [address] Phone: [phone]   Fax:  [phone]  Name: LARIAH FLEER MRN: [id number] Date of Birth: 10/08/1932

## 2020-03-12 ENCOUNTER — Ambulatory Visit (HOSPITAL_COMMUNITY): Payer: Medicare HMO

## 2020-03-12 ENCOUNTER — Encounter (HOSPITAL_COMMUNITY): Payer: Self-pay

## 2020-03-12 ENCOUNTER — Other Ambulatory Visit: Payer: Self-pay

## 2020-03-12 DIAGNOSIS — L97319 Non-pressure chronic ulcer of right ankle with unspecified severity: Secondary | ICD-10-CM

## 2020-03-12 DIAGNOSIS — R262 Difficulty in walking, not elsewhere classified: Secondary | ICD-10-CM

## 2020-03-12 DIAGNOSIS — M79661 Pain in right lower leg: Secondary | ICD-10-CM

## 2020-03-12 NOTE — Therapy (Addendum)
[place], [place], [address] Phone: [phone]   Fax:  [phone]  Wound Care Therapy  Patient Details  Name: Tina Tucker MRN: [id number] Date of Birth: [birth year] Referring Provider (PT): Wyn Forster   Encounter Date: 03/12/2020   PT End of Session - 03/12/20 1901    Visit Number 4    Number of Visits 12    Date for PT Re-Evaluation 04/16/20    Authorization Type [employer]    PT Start Time 1602    PT Stop Time 1640    PT Time Calculation (min) 38 min    Activity Tolerance Patient limited by pain    Behavior During Therapy [facility] for tasks assessed/performed           History reviewed. No pertinent past medical history.  History reviewed. No pertinent surgical history.  There were no vitals filed for this visit.    Subjective Assessment - 03/12/20 1847    Subjective Today's pt.'s birthday, stated her daughters brought her cakes and having small gathering today to celebrate.  Pt reports occasional burning, no pain scale given today.    Patient is accompained by: Family member   daughter                    Wound Therapy - 03/12/20 0001    Subjective Pt reports occasional burning, no pain scale given today.    Patient and Family Stated Goals wound to heal.    Date of Onset 11/18/19    Prior Treatments self care, MD and antibiotics    Pain Scale Faces    Faces Pain Scale Hurts even more    Pain Type Chronic pain    Pain Location Ankle    Pain Orientation Right;Medial    Pain Descriptors / Indicators Aching    Pain Onset Progressive    Patients Stated Pain Goal 0    Pain Intervention(s) Emotional support;Distraction   given squeeze ball, talked with daughter for distraction   Evaluation and Treatment Procedures Explained to Patient/Family Yes    Evaluation and Treatment Procedures agreed to    Wound Properties Date First Assessed: 03/05/20 Time First Assessed: 1540 Location: Ankle  Location Orientation: Right;Medial Wound Description (Comments): circular in nature with yellow eschar; LE in slightly indurated Present on Admission: Yes   Wound Image View All Images View Images    Dressing Type Compression wrap   Medihoney hydrogel, 2x2, profore lite wiht 1/2in foam   Dressing Changed Changed    Dressing Status Dry;Intact    Dressing Change Frequency PRN    Site / Wound Assessment Yellow;Painful    % Wound base Red or Granulating 10%    % Wound base Yellow/Fibrinous Exudate 90%    Peri-wound Assessment Erythema (blanchable)   1cm perimeter   Margins Epibole (rolled edges)    Drainage Amount Scant    Drainage Description Serous    Treatment Cleansed;Debridement (Selective)    Selective Debridement - Location wound bed    Selective Debridement - Tools Used Forceps;Scalpel    Selective Debridement - Tissue Removed slough    Wound Therapy - Clinical Statement Continues to have yellow fibrous tissue that is very adherent.  Continues to have redness 1cm perimeter of wound.  Pt limited by pain during debridement that limits selective debridement.  Emotional support, continued discussion wiht daughter and given squeeze ball as additional districitons to improve tolerance.  Continued with medihoney hydrogel with profore lite and  additional 1/2in foam on medial aspect.  Referral sent to MD for Santyl to assist with adherent slough/fibrous tissue.    Wound Therapy - Functional Problem List difficulty putting a sock on, pain upon ambulation.    Factors Delaying/Impairing Wound Healing Infection - systemic/local;Multiple medical problems;Vascular compromise;Tobacco use    Hydrotherapy Plan Debridement;Dressing change;Patient/family education;Pulsatile lavage with suction ; requested santyl due to intolerance of debridement therefore increase to three times a week    Wound Therapy - Frequency 2X / week ; increase to three times a week allow application of santyl as pt is unable to tolerate  mechanical debridement we will attempt chemical debridement to obtain a healthy granulating base.     Wound Therapy - Current Recommendations PT    Wound Plan F/U on Santyl referra to allow application of santyl as pt is unable to tolerate mechanical debridement we will attempt chemical debridement to obtain a healthy granulating base.    l, give to pt to pick up at pharmacy.  Continue debridement, dressing change.  May use pulse lavage if debridment is to painful for pt.  Follow up on sleeping using pillow, check redness and need for antibiotic.    Dressing  medihoney hydrogel, 2x2 , 1/2" foam disc,  profore lite                     PT Short Term Goals - 03/05/20 1609      PT SHORT TERM GOAL #1   Title Pt wound to be 100% granulated to prevent infection.    Time 3    Period Weeks    Status New    Target Date 03/26/20      PT SHORT TERM GOAL #2   Title PT pain to be no greater than a 5/10 in her Rt ankle to allow pt to ambulate without pain    Time 3    Period Weeks    Status New             PT Long Term Goals - 03/05/20 1618      PT LONG TERM GOAL #1   Title Rt ankle wound to be healed    Time 6    Period Weeks    Status New    Target Date 04/16/20      PT LONG TERM GOAL #2   Title PT to have no pain in her Rt ankle when walking.    Time 6    Period Weeks    Status New      PT LONG TERM GOAL #3   Title PT to have obtained and daughter to be able to [facility] and [employer] compression garments to prevent future wounds.    Time 6    Period Weeks    Status New                  Patient will benefit from skilled therapeutic intervention in order to improve the following deficits and impairments:     Visit Diagnosis: Difficulty in walking, not elsewhere classified  Chronic ulcer of right ankle, unspecified ulcer stage (HCC)  Pain in right lower leg     Problem List Patient Active Problem List   Diagnosis Date Noted  . ATHEROSLERO NATV ART EXTREM  W/INTERMIT CLAUDICAT 06/17/2009   [name], LPTA/CLT; [place]  Rayetta Humphrey, PT CLT [phone] 03/12/2020, 7:04 PM  [place] [address] [place], [place], [address] Phone: [phone]  Fax:  [phone]  Name: Tina Tucker MRN: [id number] Date of Birth: 08-21-1932

## 2020-03-13 ENCOUNTER — Telehealth (HOSPITAL_COMMUNITY): Payer: Self-pay | Admitting: Physical Therapy

## 2020-03-13 NOTE — Telephone Encounter (Signed)
S/w patient she will call Dr. Joselyn Arrow office and ask what prescription does she need to pick up for her continued wound care at our office.

## 2020-03-14 ENCOUNTER — Telehealth (HOSPITAL_COMMUNITY): Payer: Self-pay | Admitting: Physical Therapy

## 2020-03-14 NOTE — Telephone Encounter (Signed)
Called and left message concerning santyl prescription . Explained she would have to get this from the pharmacy and bring to her appt. Also explained her treatment frequency may need to increase to 3X week due to this medication.  Teena Irani, PTA/CLT [phone]

## 2020-03-14 NOTE — Telephone Encounter (Signed)
Ms Inglett returned phone call reporting she has not received any information regarding the enzymatic debridement cream.  Called Dr. Sherrie Sport office and explained to [employer] that she will need a prescription called into [facility].  Amber states she will get this over to the Dr and contact pt to let her know.   Teena Irani, PTA/CLT [phone]

## 2020-03-18 ENCOUNTER — Ambulatory Visit (HOSPITAL_COMMUNITY): Payer: Medicare HMO | Admitting: Physical Therapy

## 2020-03-18 ENCOUNTER — Other Ambulatory Visit: Payer: Self-pay

## 2020-03-18 DIAGNOSIS — R262 Difficulty in walking, not elsewhere classified: Secondary | ICD-10-CM

## 2020-03-18 DIAGNOSIS — L97319 Non-pressure chronic ulcer of right ankle with unspecified severity: Secondary | ICD-10-CM

## 2020-03-18 DIAGNOSIS — M79661 Pain in right lower leg: Secondary | ICD-10-CM | POA: Diagnosis not present

## 2020-03-18 NOTE — Addendum Note (Signed)
Addended by: Leeroy Cha on: 03/18/2020 01:18 PM   Modules accepted: Orders

## 2020-03-18 NOTE — Therapy (Signed)
[place], [place], [address] Phone: [phone]   Fax:  [phone]  Wound Care Therapy  Patient Details  Name: Tina Tucker MRN: [id number] Date of Birth: 1932-09-29 Referring Provider (PT): Wyn Forster   Encounter Date: 03/18/2020   PT End of Session - 03/18/20 1451    Visit Number 5    Number of Visits 12    Date for PT Re-Evaluation 04/16/20    Authorization Type [employer]    PT Start Time 1405    PT Stop Time 1435    PT Time Calculation (min) 30 min    Activity Tolerance Patient limited by pain    Behavior During Therapy [facility] for tasks assessed/performed           No past medical history on file.  No past surgical history on file.  There were no vitals filed for this visit.               Wound Therapy - 03/18/20 1442    Subjective pt reports she has the santyl cream for her wound.  Currently with a little soreness but no real pain.    Patient and Family Stated Goals wound to heal.    Date of Onset 11/18/19    Prior Treatments self care, MD and antibiotics    Pain Scale Faces    Faces Pain Scale Hurts little more    Pain Type Chronic pain    Pain Location Ankle    Pain Orientation Right;Medial    Pain Descriptors / Indicators Aching    Pain Onset Progressive    Evaluation and Treatment Procedures Explained to Patient/Family Yes    Evaluation and Treatment Procedures agreed to    Wound Properties Date First Assessed: 03/05/20 Time First Assessed: 1540 Location: Ankle Location Orientation: Right;Medial Wound Description (Comments): circular in nature with yellow eschar; LE in slightly indurated Present on Admission: Yes   Dressing Type Compression wrap    Dressing Changed Changed    Dressing Status Dry;Intact    Dressing Change Frequency PRN    Site / Wound Assessment Yellow;Red;Pink    % Wound base Red or Granulating 25%    % Wound base Yellow/Fibrinous Exudate 75%    Peri-wound  Assessment Erythema (blanchable)    Margins Epibole (rolled edges)    Drainage Amount Scant    Drainage Description Serous    Treatment Cleansed;Debridement (Selective)    Selective Debridement - Location wound bed    Selective Debridement - Tools Used Forceps;Scalpel    Selective Debridement - Tissue Removed slough    Wound Therapy - Clinical Statement Much improved today with reduced perimeter erythemia and increased granulation around the edges.  Unable to remove any additional slough from wound.  Cleansed well, moisturized and applied santyl.  Continued with profore lite.    Wound Therapy - Functional Problem List difficulty putting a sock on, pain upon ambulation.    Factors Delaying/Impairing Wound Healing Infection - systemic/local;Multiple medical problems;Vascular compromise;Tobacco use    Hydrotherapy Plan Debridement;Dressing change;Patient/family education;Pulsatile lavage with suction    Wound Therapy - Frequency 2X / week    Wound Therapy - Current Recommendations PT    Wound Plan continue with woundcare.    Dressing  santyl,  2x2 , 1/2" foam disc,  profore lite                     PT Short Term Goals - 03/05/20 1609  PT SHORT TERM GOAL #1   Title Pt wound to be 100% granulated to prevent infection.    Time 3    Period Weeks    Status New    Target Date 03/26/20      PT SHORT TERM GOAL #2   Title PT pain to be no greater than a 5/10 in her Rt ankle to allow pt to ambulate without pain    Time 3    Period Weeks    Status New             PT Long Term Goals - 03/05/20 1618      PT LONG TERM GOAL #1   Title Rt ankle wound to be healed    Time 6    Period Weeks    Status New    Target Date 04/16/20      PT LONG TERM GOAL #2   Title PT to have no pain in her Rt ankle when walking.    Time 6    Period Weeks    Status New      PT LONG TERM GOAL #3   Title PT to have obtained and daughter to be able to [facility] and Doff compression garments to  prevent future wounds.    Time 6    Period Weeks    Status New                  Patient will benefit from skilled therapeutic intervention in order to improve the following deficits and impairments:     Visit Diagnosis: Chronic ulcer of right ankle, unspecified ulcer stage (HCC)  Pain in right lower leg  Difficulty in walking, not elsewhere classified     Problem List Patient Active Problem List   Diagnosis Date Noted  . ATHEROSLERO NATV ART EXTREM W/INTERMIT CLAUDICAT 06/17/2009   Teena Irani, PTA/CLT [phone]  Teena Irani 03/18/2020, 2:52 PM  [place] [address] [place], [place], [address] Phone: [phone]   Fax:  [phone]  Name: Tina Tucker MRN: [id number] Date of Birth: 09-Dec-1932

## 2020-03-20 ENCOUNTER — Ambulatory Visit (HOSPITAL_COMMUNITY): Payer: Medicare HMO | Admitting: Physical Therapy

## 2020-03-20 ENCOUNTER — Encounter (HOSPITAL_COMMUNITY): Payer: Self-pay | Admitting: Physical Therapy

## 2020-03-20 ENCOUNTER — Other Ambulatory Visit: Payer: Self-pay

## 2020-03-20 DIAGNOSIS — L97319 Non-pressure chronic ulcer of right ankle with unspecified severity: Secondary | ICD-10-CM

## 2020-03-20 DIAGNOSIS — R262 Difficulty in walking, not elsewhere classified: Secondary | ICD-10-CM

## 2020-03-20 DIAGNOSIS — M79661 Pain in right lower leg: Secondary | ICD-10-CM | POA: Diagnosis not present

## 2020-03-20 NOTE — Therapy (Signed)
[place], [place], [address] Phone: [phone]   Fax:  [phone]  Wound Care Therapy  Patient Details  Name: Tina Tucker MRN: [id number] Date of Birth: 11-May-1932 Referring Provider (PT): Wyn Forster   Encounter Date: 03/20/2020   PT End of Session - 03/20/20 1530    Visit Number 6    Number of Visits 12    Date for PT Re-Evaluation 04/16/20    Authorization Type [employer]    PT Start Time 1751    PT Stop Time 1520    PT Time Calculation (min) 35 min    Activity Tolerance Patient limited by pain    Behavior During Therapy [facility] for tasks assessed/performed           History reviewed. No pertinent past medical history.  History reviewed. No pertinent surgical history.  There were no vitals filed for this visit.               Wound Therapy - 03/20/20 0001    Subjective Pt states that her wound burned a little but other wise no problem    Patient and Family Stated Goals wound to heal.    Date of Onset 11/18/19    Prior Treatments self care, MD and antibiotics    Faces Pain Scale Hurts a little bit    Pain Type Chronic pain    Pain Location Ankle    Pain Orientation Right;Medial    Pain Descriptors / Indicators Aching    Pain Intervention(s) Emotional support    Evaluation and Treatment Procedures Explained to Patient/Family Yes    Evaluation and Treatment Procedures agreed to    Wound Properties Date First Assessed: 03/05/20 Time First Assessed: 1540 Location: Ankle Location Orientation: Right;Medial Wound Description (Comments): circular in nature with yellow eschar; LE in slightly indurated Present on Admission: Yes   Dressing Type Compression wrap;Santyl    Dressing Changed Changed    Dressing Status Old drainage    Dressing Change Frequency PRN    Site / Wound Assessment Painful;Pink;Yellow    % Wound base Red or Granulating 25%    % Wound base Yellow/Fibrinous Exudate 75%     Peri-wound Assessment Erythema (blanchable)   does not expand beyond marked area   Wound Length (cm) 0.8 cm    Wound Width (cm) 1 cm    Wound Depth (cm) 0.2 cm    Wound Volume (cm^3) 0.16 cm^3    Wound Surface Area (cm^2) 0.8 cm^2    Margins Epibole (rolled edges)    Drainage Amount Minimal    Drainage Description Serous    Treatment Cleansed;Debridement (Selective)    Selective Debridement - Location wound bed    Selective Debridement - Tools Used Forceps;Scalpel    Selective Debridement - Tissue Removed slough    Wound Therapy - Clinical Statement Red halo surrounding wound has not expanded.  Pt tolerating debridement better.  Wound bed is now apparent and appears to be approximately .2 cm.  PTwill contineu to benefit from skilled PT for debridement and application of santyl.  PT states that she has obtained compression.  Therapist suggested to start donning on LT LE as LT LE has noted edema.    Wound Therapy - Functional Problem List difficulty putting a sock on, pain upon ambulation.    Factors Delaying/Impairing Wound Healing Infection - systemic/local;Multiple medical problems;Vascular compromise;Tobacco use    Hydrotherapy Plan Debridement;Dressing change;Patient/family education;Pulsatile lavage with suction    Wound  Therapy - Frequency 3X / week    Wound Therapy - Current Recommendations PT    Wound Plan continue with woundcare.    Dressing  santyl,  2x2 , 1/2" foam disc,  profore lite                     PT Short Term Goals - 03/05/20 1609      PT SHORT TERM GOAL #1   Title Pt wound to be 100% granulated to prevent infection.    Time 3    Period Weeks    Status New    Target Date 03/26/20      PT SHORT TERM GOAL #2   Title PT pain to be no greater than a 5/10 in her Rt ankle to allow pt to ambulate without pain    Time 3    Period Weeks    Status New             PT Long Term Goals - 03/05/20 1618      PT LONG TERM GOAL #1   Title Rt ankle wound to  be healed    Time 6    Period Weeks    Status New    Target Date 04/16/20      PT LONG TERM GOAL #2   Title PT to have no pain in her Rt ankle when walking.    Time 6    Period Weeks    Status New      PT LONG TERM GOAL #3   Title PT to have obtained and daughter to be able to [facility] and [employer] compression garments to prevent future wounds.    Time 6    Period Weeks    Status New                 Plan - 03/20/20 1530    Clinical Impression Statement see above    Personal Factors and Comorbidities Age;Comorbidity 3+;Fitness;Time since onset of injury/illness/exacerbation    Comorbidities COPD, CAD, THRx 2; varicosities    Examination-Activity Limitations Bathing;Dressing;Locomotion Level    Examination-Participation Restrictions Other    Stability/Clinical Decision Making Evolving/Moderate complexity    Rehab Potential Good    PT Frequency 3x / week    PT Duration 6 weeks    PT Treatment/Interventions Other (comment);Compression bandaging;Patient/family education   debridement,  santyl for chemical debridement.   PT Next Visit Plan continue debridment and dreassing change as indicated.           Patient will benefit from skilled therapeutic intervention in order to improve the following deficits and impairments:  Pain,Difficulty walking,Decreased skin integrity  Visit Diagnosis: Chronic ulcer of right ankle, unspecified ulcer stage (HCC)  Pain in right lower leg  Difficulty in walking, not elsewhere classified     Problem List Patient Active Problem List   Diagnosis Date Noted  . ATHEROSLERO NATV ART EXTREM W/INTERMIT CLAUDICAT 06/17/2009  Rayetta Humphrey, PT CLT [phone] 03/20/2020, 3:32 PM  [place] [address] [place], [place], [address] Phone: [phone]   Fax:  [phone]  Name: Tina Tucker MRN: [id number] Date of Birth: 1932/10/08

## 2020-03-21 ENCOUNTER — Encounter (HOSPITAL_COMMUNITY): Payer: Self-pay | Admitting: Physical Therapy

## 2020-03-21 ENCOUNTER — Ambulatory Visit (HOSPITAL_COMMUNITY): Payer: Medicare HMO | Admitting: Physical Therapy

## 2020-03-21 DIAGNOSIS — M79661 Pain in right lower leg: Secondary | ICD-10-CM | POA: Diagnosis not present

## 2020-03-21 DIAGNOSIS — L97319 Non-pressure chronic ulcer of right ankle with unspecified severity: Secondary | ICD-10-CM

## 2020-03-21 DIAGNOSIS — R262 Difficulty in walking, not elsewhere classified: Secondary | ICD-10-CM | POA: Diagnosis not present

## 2020-03-21 NOTE — Therapy (Signed)
[place], [place], [address] Phone: [phone]   Fax:  [phone]  Wound Care Therapy  Patient Details  Name: Tina Tucker MRN: [id number] Date of Birth: 1932/05/22 Referring Provider (PT): xjae Hasanaj   Encounter Date: 03/21/2020   PT End of Session - 03/21/20 1554    Visit Number 7    Number of Visits 12    Date for PT Re-Evaluation 04/16/20    Authorization Type [employer]    PT Start Time 6144    PT Stop Time 1515    PT Time Calculation (min) 36 min    Activity Tolerance Patient limited by pain    Behavior During Therapy [facility] for tasks assessed/performed           History reviewed. No pertinent past medical history.  History reviewed. No pertinent surgical history.  There were no vitals filed for this visit.               Wound Therapy - 03/21/20 0001    Subjective Patient with some burning at wound but overall felt fine. she is ready for her wound to heal up.    Patient and Family Stated Goals wound to heal.    Date of Onset 11/18/19    Prior Treatments self care, MD and antibiotics    Faces Pain Scale Hurts a little bit    Pain Type Chronic pain    Pain Location Ankle    Pain Orientation Right;Medial    Pain Descriptors / Indicators Aching    Pain Intervention(s) Emotional support    Evaluation and Treatment Procedures Explained to Patient/Family Yes    Evaluation and Treatment Procedures agreed to    Wound Properties Date First Assessed: 03/05/20 Time First Assessed: 1540 Location: Ankle Location Orientation: Right;Medial Wound Description (Comments): circular in nature with yellow eschar; LE in slightly indurated Present on Admission: Yes   Dressing Type Compression wrap   santyl on 2x2, foam pad over wound   Dressing Changed Changed    Dressing Status Old drainage    Dressing Change Frequency PRN    Site / Wound Assessment Painful;Pink;Yellow;Pale    % Wound base Red or  Granulating 25%    % Wound base Yellow/Fibrinous Exudate 75%    Peri-wound Assessment Erythema (blanchable)    Margins Epibole (rolled edges)    Drainage Amount Minimal    Drainage Description Serous    Treatment Cleansed;Debridement (Selective)    Selective Debridement - Location wound bed    Selective Debridement - Tools Used Forceps;Scalpel    Selective Debridement - Tissue Removed slough    Wound Therapy - Clinical Statement Wound bed with decreased yellow appearance but more of a pale white/pink/yellow color. Cleansed and debrided wound with very limited removal of devatilized tissue secondary to pain. Continued with santyl on 2x2 with foam and profore light. Patient and daughter educated on using compression garments on LLE.    Wound Therapy - Functional Problem List difficulty putting a sock on, pain upon ambulation.    Factors Delaying/Impairing Wound Healing Infection - systemic/local;Multiple medical problems;Vascular compromise;Tobacco use    Hydrotherapy Plan Debridement;Dressing change;Patient/family education;Pulsatile lavage with suction    Wound Therapy - Frequency 3X / week    Wound Therapy - Current Recommendations PT    Wound Plan continue with woundcare.    Dressing  santyl,  2x2 , 1/2" foam disc,  profore lite  PT Short Term Goals - 03/05/20 1609      PT SHORT TERM GOAL #1   Title Pt wound to be 100% granulated to prevent infection.    Time 3    Period Weeks    Status New    Target Date 03/26/20      PT SHORT TERM GOAL #2   Title PT pain to be no greater than a 5/10 in her Rt ankle to allow pt to ambulate without pain    Time 3    Period Weeks    Status New             PT Long Term Goals - 03/05/20 1618      PT LONG TERM GOAL #1   Title Rt ankle wound to be healed    Time 6    Period Weeks    Status New    Target Date 04/16/20      PT LONG TERM GOAL #2   Title PT to have no pain in her Rt ankle when walking.     Time 6    Period Weeks    Status New      PT LONG TERM GOAL #3   Title PT to have obtained and daughter to be able to [facility] and [employer] compression garments to prevent future wounds.    Time 6    Period Weeks    Status New                 Plan - 03/21/20 1555    Clinical Impression Statement see above    Personal Factors and Comorbidities Age;Comorbidity 3+;Fitness;Time since onset of injury/illness/exacerbation    Comorbidities COPD, CAD, THRx 2; varicosities    Examination-Activity Limitations Bathing;Dressing;Locomotion Level    Examination-Participation Restrictions Other    Stability/Clinical Decision Making Evolving/Moderate complexity    Rehab Potential Good    PT Frequency 3x / week    PT Duration 6 weeks    PT Treatment/Interventions Other (comment);Compression bandaging;Patient/family education   debridement,  santyl for chemical debridement.   PT Next Visit Plan continue debridment and dreassing change as indicated.           Patient will benefit from skilled therapeutic intervention in order to improve the following deficits and impairments:  Pain,Difficulty walking,Decreased skin integrity  Visit Diagnosis: Chronic ulcer of right ankle, unspecified ulcer stage (HCC)  Pain in right lower leg  Difficulty in walking, not elsewhere classified     Problem List Patient Active Problem List   Diagnosis Date Noted  . ATHEROSLERO NATV ART EXTREM W/INTERMIT CLAUDICAT 06/17/2009    4:04 PM, 03/21/20 [name] PT, DPT Physical Therapist at [place], [place], [address] Phone: [phone]   Fax:  [phone]  Name: Tina Tucker MRN: [id number] Date of Birth: September 18, 1932

## 2020-03-25 ENCOUNTER — Encounter (HOSPITAL_COMMUNITY): Payer: Self-pay | Admitting: Physical Therapy

## 2020-03-25 ENCOUNTER — Ambulatory Visit (HOSPITAL_COMMUNITY): Payer: Medicare HMO | Admitting: Physical Therapy

## 2020-03-25 ENCOUNTER — Other Ambulatory Visit: Payer: Self-pay

## 2020-03-25 DIAGNOSIS — L97319 Non-pressure chronic ulcer of right ankle with unspecified severity: Secondary | ICD-10-CM

## 2020-03-25 DIAGNOSIS — R262 Difficulty in walking, not elsewhere classified: Secondary | ICD-10-CM | POA: Diagnosis not present

## 2020-03-25 DIAGNOSIS — M79661 Pain in right lower leg: Secondary | ICD-10-CM

## 2020-03-25 NOTE — Therapy (Signed)
[place], [place], [address] Phone: [phone]   Fax:  [phone]  Wound Care Therapy  Patient Details  Name: Tina Tucker MRN: [id number] Date of Birth: 1932/10/09 Referring Provider (PT): Wyn Forster   Encounter Date: 03/25/2020   PT End of Session - 03/25/20 1449    Visit Number 8    Number of Visits 12    Date for PT Re-Evaluation 04/16/20    Authorization Type [employer]    PT Start Time 1402    PT Stop Time 1440    PT Time Calculation (min) 38 min    Activity Tolerance Patient limited by pain    Behavior During Therapy [facility] for tasks assessed/performed           History reviewed. No pertinent past medical history.  History reviewed. No pertinent surgical history.  There were no vitals filed for this visit.               Wound Therapy - 03/25/20 0001    Subjective Patient states her wound has been burning.    Patient and Family Stated Goals wound to heal.    Date of Onset 11/18/19    Prior Treatments self care, MD and antibiotics    Faces Pain Scale Hurts little more    Pain Type Chronic pain    Pain Location Ankle    Pain Orientation Right;Medial    Pain Descriptors / Indicators Burning    Pain Intervention(s) Emotional support    Evaluation and Treatment Procedures Explained to Patient/Family Yes    Evaluation and Treatment Procedures agreed to    Wound Properties Date First Assessed: 03/05/20 Time First Assessed: 1540 Location: Ankle Location Orientation: Right;Medial Wound Description (Comments): circular in nature with yellow eschar; LE in slightly indurated Present on Admission: Yes   Wound Image View All Images View Images    Dressing Type Compression wrap   santyl on 2x2, foam pad over wound, vasoline around wound   Dressing Changed Changed    Dressing Status Old drainage    Dressing Change Frequency PRN    Site / Wound Assessment Painful;Pink;Pale   white   % Wound  base Red or Granulating 25%    % Wound base Yellow/Fibrinous Exudate 75%    Peri-wound Assessment Erythema (blanchable)    Wound Length (cm) 0.8 cm    Wound Width (cm) 1 cm    Wound Depth (cm) 0.2 cm    Wound Volume (cm^3) 0.16 cm^3    Wound Surface Area (cm^2) 0.8 cm^2    Margins Epibole (rolled edges)    Drainage Amount Minimal    Drainage Description Serous    Treatment Debridement (Selective);Cleansed    Selective Debridement - Location wound bed    Selective Debridement - Tools Used Forceps;Scalpel    Selective Debridement - Tissue Removed slough    Wound Therapy - Clinical Statement Patient wound size same as last measurement last week but with decreased yellow slough with mostly very adherent white tissue remaining. Patient does not tolerate debridement well and is limited by pain. Continued with santyl to wound bed with Vaseline surrounding wound with 2x2, foam, and profore lite. Patient educated again on use of compression garments for LE edema.    Wound Therapy - Functional Problem List difficulty putting a sock on, pain upon ambulation.    Factors Delaying/Impairing Wound Healing Infection - systemic/local;Multiple medical problems;Vascular compromise;Tobacco use    Hydrotherapy Plan Debridement;Dressing change;Patient/family education;Pulsatile lavage  with suction    Wound Therapy - Frequency 3X / week    Wound Therapy - Current Recommendations PT    Wound Plan continue with woundcare.    Dressing  santyl,  2x2 , 1/2" foam disc,  profore lite                   PT Education - 03/25/20 1448    Education Details importance of compression for LE edema reduction    Person(s) Educated Patient;Child(ren)    Methods Explanation    Comprehension Verbalized understanding            PT Short Term Goals - 03/05/20 1609      PT SHORT TERM GOAL #1   Title Pt wound to be 100% granulated to prevent infection.    Time 3    Period Weeks    Status New    Target Date  03/26/20      PT SHORT TERM GOAL #2   Title PT pain to be no greater than a 5/10 in her Rt ankle to allow pt to ambulate without pain    Time 3    Period Weeks    Status New             PT Long Term Goals - 03/05/20 1618      PT LONG TERM GOAL #1   Title Rt ankle wound to be healed    Time 6    Period Weeks    Status New    Target Date 04/16/20      PT LONG TERM GOAL #2   Title PT to have no pain in her Rt ankle when walking.    Time 6    Period Weeks    Status New      PT LONG TERM GOAL #3   Title PT to have obtained and daughter to be able to [facility] and Doff compression garments to prevent future wounds.    Time 6    Period Weeks    Status New                 Plan - 03/25/20 1449    Clinical Impression Statement See above    Personal Factors and Comorbidities Age;Comorbidity 3+;Fitness;Time since onset of injury/illness/exacerbation    Comorbidities COPD, CAD, THRx 2; varicosities    Examination-Activity Limitations Bathing;Dressing;Locomotion Level    Examination-Participation Restrictions Other    Stability/Clinical Decision Making Evolving/Moderate complexity    Rehab Potential Good    PT Frequency 3x / week    PT Duration 6 weeks    PT Treatment/Interventions Other (comment);Compression bandaging;Patient/family education   debridement,  santyl for chemical debridement.   PT Next Visit Plan continue debridment and dreassing change as indicated.           Patient will benefit from skilled therapeutic intervention in order to improve the following deficits and impairments:  Pain,Difficulty walking,Decreased skin integrity  Visit Diagnosis: Chronic ulcer of right ankle, unspecified ulcer stage (HCC)  Pain in right lower leg  Difficulty in walking, not elsewhere classified     Problem List Patient Active Problem List   Diagnosis Date Noted  . ATHEROSLERO NATV ART EXTREM W/INTERMIT CLAUDICAT 06/17/2009    3:57 PM, 03/25/20 [name] PT, DPT Physical Therapist at [place], [place], [address] Phone: [phone]   Fax:  [phone]  Name: Tina Cinnamon  Tucker MRN: [id number] Date of Birth: 1932-09-21

## 2020-03-27 ENCOUNTER — Ambulatory Visit (HOSPITAL_COMMUNITY): Payer: Medicare HMO

## 2020-03-27 ENCOUNTER — Other Ambulatory Visit: Payer: Self-pay

## 2020-03-27 ENCOUNTER — Encounter (HOSPITAL_COMMUNITY): Payer: Self-pay

## 2020-03-27 DIAGNOSIS — M79661 Pain in right lower leg: Secondary | ICD-10-CM

## 2020-03-27 DIAGNOSIS — L97319 Non-pressure chronic ulcer of right ankle with unspecified severity: Secondary | ICD-10-CM | POA: Diagnosis not present

## 2020-03-27 DIAGNOSIS — R262 Difficulty in walking, not elsewhere classified: Secondary | ICD-10-CM

## 2020-03-27 NOTE — Therapy (Signed)
[place], [place], [address] Phone: [phone]   Fax:  [phone]  Wound Care Therapy  Patient Details  Name: Tina Tucker MRN: [id number] Date of Birth: 1932/07/28 Referring Provider (PT): Wyn Forster   Encounter Date: 03/27/2020   PT End of Session - 03/27/20 1531    Visit Number 9    Number of Visits 12    Date for PT Re-Evaluation 04/16/20    Authorization Type [employer]    PT Start Time 1446    PT Stop Time 1525    PT Time Calculation (min) 39 min    Activity Tolerance Patient limited by pain;Patient tolerated treatment well    Behavior During Therapy [facility] for tasks assessed/performed           History reviewed. No pertinent past medical history.  History reviewed. No pertinent surgical history.  There were no vitals filed for this visit.    Subjective Assessment - 03/27/20 1529    Subjective Pt stated she is nervous today, stated her nerves are getting to her.  No reports of pain today.                     Wound Therapy - 03/27/20 0001    Subjective Pt stated she is nervous today, stated her nerves are getting to her.  No reports of pain today.    Patient and Family Stated Goals wound to heal.    Date of Onset 11/18/19    Prior Treatments self care, MD and antibiotics    Pain Scale 0-10    Pain Score 0-No pain    Pain Intervention(s) Emotional support   ball squeeze   Wound Properties Date First Assessed: 03/05/20 Time First Assessed: 1540 Location: Ankle Location Orientation: Right;Medial Wound Description (Comments): circular in nature with yellow eschar; LE in slightly indurated Present on Admission: Yes   Wound Image View All Images View Images    Dressing Type Compression wrap   Santyl, 4x4, foam, profore lite   Dressing Changed Changed    Dressing Status Old drainage    Dressing Change Frequency PRN    Site / Wound Assessment Painful;Pink;Pale   white   % Wound base Red  or Granulating 30%    % Wound base Yellow/Fibrinous Exudate 70%    Peri-wound Assessment Erythema (blanchable)    Wound Length (cm) 0.8 cm    Wound Width (cm) 1 cm    Wound Depth (cm) 0.2 cm    Wound Volume (cm^3) 0.16 cm^3    Wound Surface Area (cm^2) 0.8 cm^2    Margins Epibole (rolled edges)    Drainage Amount Minimal    Drainage Description Serous    Treatment Cleansed;Debridement (Selective)    Selective Debridement - Location wound bed    Selective Debridement - Tools Used Forceps;Scalpel    Selective Debridement - Tissue Removed slough    Wound Therapy - Clinical Statement Pt continues to be limited by pain wiht debridment.  Wound measurements same as last session.  Able to remove minimal amount of slough from wound bed, white tissue continues to be adherent.  Continued with santyl wound bed, vaseline perimeter and profore lite.  Marked redness surrounding wound, daughter present for session and reports decreased in redness.  Discussed benefits wiht compression garments, verbalized understanding.    Wound Therapy - Functional Problem List difficulty putting a sock on, pain upon ambulation.    Factors Delaying/Impairing Wound Healing Infection -  systemic/local;Multiple medical problems;Vascular compromise;Tobacco use    Hydrotherapy Plan Debridement;Dressing change;Patient/family education;Pulsatile lavage with suction    Wound Therapy - Frequency 3X / week    Wound Therapy - Current Recommendations PT    Wound Plan 10th visit next session.  If wound size stays the same, ABI.  continue with woundcare.    Dressing  santyl,  2x2 , 1/2" foam disc,  profore lite                     PT Short Term Goals - 03/05/20 1609      PT SHORT TERM GOAL #1   Title Pt wound to be 100% granulated to prevent infection.    Time 3    Period Weeks    Status New    Target Date 03/26/20      PT SHORT TERM GOAL #2   Title PT pain to be no greater than a 5/10 in her Rt ankle to allow pt to  ambulate without pain    Time 3    Period Weeks    Status New             PT Long Term Goals - 03/05/20 1618      PT LONG TERM GOAL #1   Title Rt ankle wound to be healed    Time 6    Period Weeks    Status New    Target Date 04/16/20      PT LONG TERM GOAL #2   Title PT to have no pain in her Rt ankle when walking.    Time 6    Period Weeks    Status New      PT LONG TERM GOAL #3   Title PT to have obtained and daughter to be able to [facility] and Doff compression garments to prevent future wounds.    Time 6    Period Weeks    Status New                  Patient will benefit from skilled therapeutic intervention in order to improve the following deficits and impairments:     Visit Diagnosis: Chronic ulcer of right ankle, unspecified ulcer stage (HCC)  Pain in right lower leg  Difficulty in walking, not elsewhere classified     Problem List Patient Active Problem List   Diagnosis Date Noted  . ATHEROSLERO NATV ART EXTREM W/INTERMIT CLAUDICAT 06/17/2009   [name], LPTA/CLT; CBIS [phone]  Aldona Lento 03/27/2020, 4:17 PM  [place] [address] [place], [place], [address] Phone: [phone]   Fax:  [phone]  Name: Tina Tucker MRN: [id number] Date of Birth: August 20, 1932

## 2020-03-28 ENCOUNTER — Ambulatory Visit (HOSPITAL_COMMUNITY): Payer: Medicare HMO | Admitting: Physical Therapy

## 2020-03-28 DIAGNOSIS — M79661 Pain in right lower leg: Secondary | ICD-10-CM

## 2020-03-28 DIAGNOSIS — L97319 Non-pressure chronic ulcer of right ankle with unspecified severity: Secondary | ICD-10-CM | POA: Diagnosis not present

## 2020-03-28 DIAGNOSIS — R262 Difficulty in walking, not elsewhere classified: Secondary | ICD-10-CM | POA: Diagnosis not present

## 2020-03-28 NOTE — Therapy (Addendum)
[place], [place], [address] Phone: [phone]   Fax:  [phone]  Wound Care Therapy  Patient Details  Name: Tina Tucker MRN: [id number] Date of Birth: [birth year] Referring Provider (PT): xjae Hasanaj Progress Note Reporting Period 03/05/2020 to 03/28/2020  See note below for Objective Data and Assessment of Progress/Goals.       Encounter Date: 03/28/2020   PT End of Session - 03/28/20 1709    Visit Number 10    Number of Visits 18    Date for PT Re-Evaluation 04/27/20    Authorization Type [employer]    Authorization - Visit Number 10    Authorization - Number of Visits 12    PT Start Time 1350    PT Stop Time 1430    PT Time Calculation (min) 40 min    Activity Tolerance Patient limited by pain;Patient tolerated treatment well    Behavior During Therapy [facility] for tasks assessed/performed           No past medical history on file.  No past surgical history on file.  There were no vitals filed for this visit.               Wound Therapy - 03/28/20 0001    Subjective PT states that her leg is not hurting like it was    Patient and Family Stated Goals wound to heal.    Date of Onset 11/18/19    Prior Treatments self care, MD and antibiotics    Pain Score 0-No pain    Evaluation and Treatment Procedures Explained to Patient/Family Yes    Evaluation and Treatment Procedures agreed to    Wound Properties Date First Assessed: 03/05/20 Time First Assessed: 1540 Location: Ankle Location Orientation: Right;Medial Wound Description (Comments): circular in nature with yellow eschar; LE in slightly indurated Present on Admission: Yes   Dressing Type Compression wrap;Santyl    Dressing Changed Changed    Dressing Status Old drainage    Dressing Change Frequency PRN    Site / Wound Assessment Painful;Pale;Pink    % Wound base Red or Granulating 40%   initally 10%   % Wound base Yellow/Fibrinous  Exudate 60%   initally 90%   Wound Length (cm) 0.8 cm   was 1   Wound Width (cm) 1 cm   was 1.2   Wound Depth (cm) 0.2 cm    Wound Volume (cm^3) 0.16 cm^3    Wound Surface Area (cm^2) 0.8 cm^2    Undermining (cm) from 6-12    Margins Epibole (rolled edges)    Drainage Amount Minimal    Drainage Description Serous    Treatment Cleansed;Debridement (Selective)    Selective Debridement - Location wound bed    Selective Debridement - Tools Used Forceps;Scalpel    Selective Debridement - Tissue Removed slough    Wound Therapy - Clinical Statement Santyl is loosening up slough slowly.  PT is stil very nervous with mechnical debridment.  Noted undermining from 6-12 this session    Wound Therapy - Functional Problem List difficulty putting a sock on, pain upon ambulation.    Factors Delaying/Impairing Wound Healing Infection - systemic/local;Multiple medical problems;Vascular compromise;Tobacco use    Hydrotherapy Plan Debridement;Dressing change;Patient/family education;Pulsatile lavage with suction    Wound Therapy - Frequency 3X / week    Wound Therapy - Current Recommendations PT    Wound Plan continue with santyl and debridement    Dressing  santyl,  2x2 ,  profore lite                     PT Short Term Goals - 03/05/20 1609      PT SHORT TERM GOAL #1   Title Pt wound to be 100% granulated to prevent infection.    Time 3    Period Weeks    Status New    Target Date 03/26/20      PT SHORT TERM GOAL #2   Title PT pain to be no greater than a 5/10 in her Rt ankle to allow pt to ambulate without pain    Time 3    Period Weeks    Status New             PT Long Term Goals - 03/05/20 1618      PT LONG TERM GOAL #1   Title Rt ankle wound to be healed    Time 6    Period Weeks    Status New    Target Date 04/16/20      PT LONG TERM GOAL #2   Title PT to have no pain in her Rt ankle when walking.    Time 6    Period Weeks    Status New      PT LONG TERM GOAL  #3   Title PT to have obtained and daughter to be able to [facility] and Doff compression garments to prevent future wounds.    Time 6    Period Weeks    Status New                 Plan - 03/28/20 1711    Clinical Impression Statement see above    Personal Factors and Comorbidities Age;Comorbidity 3+;Fitness;Time since onset of injury/illness/exacerbation    Comorbidities COPD, CAD, THRx 2; varicosities    Examination-Activity Limitations Bathing;Dressing;Locomotion Level    Examination-Participation Restrictions Other    Stability/Clinical Decision Making Evolving/Moderate complexity    Rehab Potential Good    PT Frequency 3x / week    PT Duration 6 weeks    PT Treatment/Interventions Other (comment);Compression bandaging;Patient/family education   debridement,  santyl for chemical debridement.   PT Next Visit Plan continue debridment and dreassing change as indicated.           Patient will benefit from skilled therapeutic intervention in order to improve the following deficits and impairments:  Pain,Difficulty walking,Decreased skin integrity  Visit Diagnosis: Chronic ulcer of right ankle, unspecified ulcer stage (HCC)  Pain in right lower leg     Problem List Patient Active Problem List   Diagnosis Date Noted  . ATHEROSLERO NATV ART EXTREM W/INTERMIT CLAUDICAT 06/17/2009    Rayetta Humphrey, PT CLT [phone] 03/28/2020, 5:12 PM  [place] [address] [place], [place], [address] Phone: [phone]   Fax:  [phone]  Name: Tina Tucker MRN: [id number] Date of Birth: 12-21-1932

## 2020-04-01 ENCOUNTER — Other Ambulatory Visit: Payer: Self-pay

## 2020-04-01 ENCOUNTER — Ambulatory Visit (HOSPITAL_COMMUNITY): Payer: Medicare HMO

## 2020-04-01 ENCOUNTER — Encounter (HOSPITAL_COMMUNITY): Payer: Self-pay

## 2020-04-01 DIAGNOSIS — M79661 Pain in right lower leg: Secondary | ICD-10-CM | POA: Diagnosis not present

## 2020-04-01 DIAGNOSIS — R262 Difficulty in walking, not elsewhere classified: Secondary | ICD-10-CM

## 2020-04-01 DIAGNOSIS — L97319 Non-pressure chronic ulcer of right ankle with unspecified severity: Secondary | ICD-10-CM

## 2020-04-01 NOTE — Therapy (Signed)
[place], [place], [address] Phone: [phone]   Fax:  [phone]  Wound Care Therapy  Patient Details  Name: Tina Tucker MRN: [id number] Date of Birth: [birth year] Referring Provider (PT): Wyn Forster   Encounter Date: 04/01/2020   PT End of Session - 04/01/20 1355    Visit Number 11    Number of Visits 18    Date for PT Re-Evaluation 04/27/20    Authorization Type [employer]    Authorization - Visit Number 11    Authorization - Number of Visits 12   Therapist requested additional sessoin thru [place] on 3/25 visit #10   PT Start Time 2703    PT Stop Time 1345    PT Time Calculation (min) 37 min    Activity Tolerance Patient limited by pain;Patient tolerated treatment well    Behavior During Therapy [facility] for tasks assessed/performed           History reviewed. No pertinent past medical history.  History reviewed. No pertinent surgical history.  There were no vitals filed for this visit.    Subjective Assessment - 04/01/20 1350    Subjective Pt stated she is pain free today.  Continues to be nervous with treatment.    Patient is accompained by: Family member   daughter   Currently in Pain? No/denies                     Wound Therapy - 04/01/20 0001    Subjective Pt stated she is pain free today.  Continues to be nervous with treatment.    Patient and Family Stated Goals wound to heal.    Date of Onset 11/18/19    Prior Treatments self care, MD and antibiotics    Pain Scale 0-10    Evaluation and Treatment Procedures Explained to Patient/Family Yes    Evaluation and Treatment Procedures agreed to    Wound Properties Date First Assessed: 03/05/20 Time First Assessed: 1540 Location: Ankle Location Orientation: Right;Medial Wound Description (Comments): circular in nature with yellow eschar; LE in slightly indurated Present on Admission: Yes   Wound Image View All Images View Images     Dressing Type Compression wrap   Santyl, 2x2, profore lite with additional cotton   Dressing Changed Changed    Dressing Status Old drainage    Dressing Change Frequency PRN    Site / Wound Assessment Painful;Pale;Pink    % Wound base Red or Granulating 45%    % Wound base Yellow/Fibrinous Exudate 55%    Peri-wound Assessment Erythema (blanchable)    Wound Length (cm) 0.8 cm   was 1 initial eval   Wound Width (cm) 1 cm   was 1.2 initial eval   Wound Depth (cm) 0.2 cm    Wound Volume (cm^3) 0.16 cm^3    Wound Surface Area (cm^2) 0.8 cm^2    Margins Epibole (rolled edges)    Drainage Amount Minimal    Drainage Description Serous    Treatment Cleansed;Debridement (Selective)    Selective Debridement - Location wound bed    Selective Debridement - Tools Used Forceps;Scalpel    Selective Debridement - Tissue Removed slough    Wound Therapy - Clinical Statement Easier to remove slough from wound bed especially distal aspect, continues to have white tissue/adherent slough superior portion.  Pt with improved tolerance with selective debridement this session.  Continued with Santyl and profore lite for edema control.    Wound Therapy -  Functional Problem List difficulty putting a sock on, pain upon ambulation.    Factors Delaying/Impairing Wound Healing Infection - systemic/local;Multiple medical problems;Vascular compromise;Tobacco use    Hydrotherapy Plan Debridement;Dressing change;Patient/family education;Pulsatile lavage with suction    Wound Therapy - Frequency 3X / week    Wound Therapy - Current Recommendations PT    Wound Plan continue with santyl and debridement    Dressing  santyl,  2x2 ,   profore lite                     PT Short Term Goals - 03/05/20 1609      PT SHORT TERM GOAL #1   Title Pt wound to be 100% granulated to prevent infection.    Time 3    Period Weeks    Status New    Target Date 03/26/20      PT SHORT TERM GOAL #2   Title PT pain to be no  greater than a 5/10 in her Rt ankle to allow pt to ambulate without pain    Time 3    Period Weeks    Status New             PT Long Term Goals - 03/05/20 1618      PT LONG TERM GOAL #1   Title Rt ankle wound to be healed    Time 6    Period Weeks    Status New    Target Date 04/16/20      PT LONG TERM GOAL #2   Title PT to have no pain in her Rt ankle when walking.    Time 6    Period Weeks    Status New      PT LONG TERM GOAL #3   Title PT to have obtained and daughter to be able to [facility] and Doff compression garments to prevent future wounds.    Time 6    Period Weeks    Status New                  Patient will benefit from skilled therapeutic intervention in order to improve the following deficits and impairments:     Visit Diagnosis: Chronic ulcer of right ankle, unspecified ulcer stage (HCC)  Pain in right lower leg  Difficulty in walking, not elsewhere classified     Problem List Patient Active Problem List   Diagnosis Date Noted  . ATHEROSLERO NATV ART EXTREM W/INTERMIT CLAUDICAT 06/17/2009   [name], LPTA/CLT; CBIS [phone]  Aldona Lento 04/01/2020, 1:57 PM  [place], [place], [address] Phone: [phone]   Fax:  [phone]  Name: Tina Tucker MRN: [id number] Date of Birth: 11-27-1932

## 2020-04-02 DIAGNOSIS — J449 Chronic obstructive pulmonary disease, unspecified: Secondary | ICD-10-CM | POA: Diagnosis not present

## 2020-04-02 DIAGNOSIS — I872 Venous insufficiency (chronic) (peripheral): Secondary | ICD-10-CM | POA: Diagnosis not present

## 2020-04-02 DIAGNOSIS — I10 Essential (primary) hypertension: Secondary | ICD-10-CM | POA: Diagnosis not present

## 2020-04-03 ENCOUNTER — Ambulatory Visit (HOSPITAL_COMMUNITY): Payer: Medicare HMO | Admitting: Physical Therapy

## 2020-04-03 ENCOUNTER — Encounter (HOSPITAL_COMMUNITY): Payer: Self-pay

## 2020-04-04 ENCOUNTER — Ambulatory Visit (HOSPITAL_COMMUNITY): Payer: Medicare HMO | Attending: Internal Medicine

## 2020-04-04 ENCOUNTER — Other Ambulatory Visit: Payer: Self-pay

## 2020-04-04 ENCOUNTER — Encounter (HOSPITAL_COMMUNITY): Payer: Self-pay

## 2020-04-04 DIAGNOSIS — R262 Difficulty in walking, not elsewhere classified: Secondary | ICD-10-CM | POA: Diagnosis not present

## 2020-04-04 DIAGNOSIS — M79661 Pain in right lower leg: Secondary | ICD-10-CM | POA: Insufficient documentation

## 2020-04-04 DIAGNOSIS — L97319 Non-pressure chronic ulcer of right ankle with unspecified severity: Secondary | ICD-10-CM | POA: Insufficient documentation

## 2020-04-04 NOTE — Therapy (Signed)
[place], [place], [address] Phone: [phone]   Fax:  [phone]  Wound Care Therapy  Patient Details  Name: Tina Tucker MRN: [id number] Date of Birth: 03/01/1932 Referring Provider (PT): xjae Hasanaj   Encounter Date: 04/04/2020   PT End of Session - 04/04/20 1434    Visit Number 12    Number of Visits 18    Date for PT Re-Evaluation 04/27/20    Authorization Type [employer]    Authorization - Visit Number 12    Authorization - Number of Visits 12   Therapist requested additional sessoin thru [place] on 3/25 visit #10   PT Start Time 5462    PT Stop Time 1352    PT Time Calculation (min) 39 min    Activity Tolerance Patient limited by pain;Patient tolerated treatment well    Behavior During Therapy [facility] for tasks assessed/performed           History reviewed. No pertinent past medical history.  History reviewed. No pertinent surgical history.  There were no vitals filed for this visit.    Subjective Assessment - 04/04/20 1429    Subjective No reports of pain except during debridement.    Patient is accompained by: Family member   daughter   Currently in Pain? No/denies                     Wound Therapy - 04/04/20 0001    Subjective No reports of pain except during debridement.    Patient and Family Stated Goals wound to heal.    Date of Onset 11/18/19    Prior Treatments self care, MD and antibiotics    Pain Scale 0-10    Evaluation and Treatment Procedures Explained to Patient/Family Yes    Evaluation and Treatment Procedures agreed to    Wound Properties Date First Assessed: 03/05/20 Time First Assessed: 1540 Location: Ankle Location Orientation: Right;Medial Wound Description (Comments): circular in nature with yellow eschar; LE in slightly indurated Present on Admission: Yes   Wound Image View All Images View Images    Dressing Type --   Santyl, 2x2, profore lite   Dressing  Changed Changed    Dressing Status Old drainage    Dressing Change Frequency PRN    Site / Wound Assessment Painful;Pale;Pink    % Wound base Red or Granulating 45%    % Wound base Yellow/Fibrinous Exudate 55%    Peri-wound Assessment Erythema (blanchable)    Wound Length (cm) 0.8 cm   was 1 initial eval   Wound Width (cm) 1 cm   was 1.2cm initial eval   Wound Depth (cm) 0.2 cm    Wound Volume (cm^3) 0.16 cm^3    Wound Surface Area (cm^2) 0.8 cm^2    Undermining (cm) 8-12    Margins Epibole (rolled edges)    Drainage Amount Minimal    Drainage Description Serous    Treatment Cleansed;Debridement (Selective)    Selective Debridement - Location wound bed    Selective Debridement - Tools Used Forceps;Scalpel    Selective Debridement - Tissue Removed slough    Wound Therapy - Clinical Statement Wound minimal changes, same size and depth.  Referral sent to referring MD for ABI.  Continues to be limited by pain with debridement and pt requested several breaks through session.  Continued with santyl and profore lite for edema control.  Reports of comfort at [facility].    Wound Therapy - Functional Problem  List difficulty putting a sock on, pain upon ambulation.    Factors Delaying/Impairing Wound Healing Infection - systemic/local;Multiple medical problems;Vascular compromise;Tobacco use    Hydrotherapy Plan Debridement;Dressing change;Patient/family education;Pulsatile lavage with suction    Wound Therapy - Frequency 3X / week    Wound Therapy - Current Recommendations PT    Wound Plan Referral sent for ABI.  continue with santyl and debridement    Dressing  santyl,  2x2 ,   profore lite                     PT Short Term Goals - 03/05/20 1609      PT SHORT TERM GOAL #1   Title Pt wound to be 100% granulated to prevent infection.    Time 3    Period Weeks    Status New    Target Date 03/26/20      PT SHORT TERM GOAL #2   Title PT pain to be no greater than a 5/10 in her Rt  ankle to allow pt to ambulate without pain    Time 3    Period Weeks    Status New             PT Long Term Goals - 03/05/20 1618      PT LONG TERM GOAL #1   Title Rt ankle wound to be healed    Time 6    Period Weeks    Status New    Target Date 04/16/20      PT LONG TERM GOAL #2   Title PT to have no pain in her Rt ankle when walking.    Time 6    Period Weeks    Status New      PT LONG TERM GOAL #3   Title PT to have obtained and daughter to be able to [facility] and Doff compression garments to prevent future wounds.    Time 6    Period Weeks    Status New                  Patient will benefit from skilled therapeutic intervention in order to improve the following deficits and impairments:     Visit Diagnosis: Chronic ulcer of right ankle, unspecified ulcer stage (HCC)  Pain in right lower leg  Difficulty in walking, not elsewhere classified     Problem List Patient Active Problem List   Diagnosis Date Noted  . ATHEROSLERO NATV ART EXTREM W/INTERMIT CLAUDICAT 06/17/2009   [name], LPTA/CLT; CBIS [phone]  Aldona Lento 04/04/2020, 2:49 PM  [place] [address] [place], [place], [address] Phone: [phone]   Fax:  [phone]  Name: Tina Tucker MRN: [id number] Date of Birth: [birth year]

## 2020-04-08 ENCOUNTER — Encounter (HOSPITAL_COMMUNITY): Payer: Self-pay | Admitting: Physical Therapy

## 2020-04-08 ENCOUNTER — Other Ambulatory Visit: Payer: Self-pay

## 2020-04-08 ENCOUNTER — Ambulatory Visit (HOSPITAL_COMMUNITY): Payer: Medicare HMO | Admitting: Physical Therapy

## 2020-04-08 DIAGNOSIS — M79661 Pain in right lower leg: Secondary | ICD-10-CM | POA: Diagnosis not present

## 2020-04-08 DIAGNOSIS — L97319 Non-pressure chronic ulcer of right ankle with unspecified severity: Secondary | ICD-10-CM

## 2020-04-08 DIAGNOSIS — R262 Difficulty in walking, not elsewhere classified: Secondary | ICD-10-CM

## 2020-04-08 NOTE — Therapy (Signed)
[place], [place], [address] Phone: [phone]   Fax:  [phone]  Wound Care Therapy  Patient Details  Name: Tina Tucker MRN: [id number] Date of Birth: 1932/04/27 Referring Provider (PT): xjae Hasanaj   Encounter Date: 04/08/2020   PT End of Session - 04/08/20 1434    Visit Number 13    Number of Visits 18    Date for PT Re-Evaluation 04/27/20    Authorization Type [employer]    Authorization - Visit Number 13    Authorization - Number of Visits 12   Therapist requested additional sessoin thru [place] on 3/25 visit #10   PT Start Time 4496    PT Stop Time 1525    PT Time Calculation (min) 40 min    Activity Tolerance Patient limited by pain;Patient tolerated treatment well    Behavior During Therapy [facility] for tasks assessed/performed           History reviewed. No pertinent past medical history.  History reviewed. No pertinent surgical history.  There were no vitals filed for this visit.      [facility] PT Assessment - 04/08/20 0001      Assessment   Medical Diagnosis non Healing right ankle wound    Referring Provider (PT) xjae Hasanaj                   Wound Therapy - 04/08/20 0001    Subjective No reports of pain except during debridement.    Patient and Family Stated Goals wound to heal.    Date of Onset 11/18/19    Prior Treatments self care, MD and antibiotics    Evaluation and Treatment Procedures Explained to Patient/Family Yes    Evaluation and Treatment Procedures agreed to    Wound Properties Date First Assessed: 03/05/20 Time First Assessed: 1540 Location: Ankle Location Orientation: Right;Medial Wound Description (Comments): circular in nature with yellow eschar; LE in slightly indurated Present on Admission: Yes   Wound Image View All Images View Images    Dressing Type Compression wrap   santyl   Dressing Changed Changed    Dressing Status Old drainage    Dressing Change  Frequency PRN    Site / Wound Assessment Painful;Pink;Pale    % Wound base Red or Granulating 45%    % Wound base Yellow/Fibrinous Exudate 55%    Peri-wound Assessment Erythema (blanchable);Edema    Undermining (cm) 7-11    Margins Epibole (rolled edges)    Drainage Amount Minimal    Drainage Description Serous    Treatment Cleansed;Debridement (Selective)    Selective Debridement - Location wound bed    Selective Debridement - Tools Used Forceps;Scalpel    Selective Debridement - Tissue Removed slough    Wound Therapy - Clinical Statement Overall wound looks similar to previous sessions. No word from MD about ABI, encouraged patient to call MD to follow up with order that was sent off. Undermining noted from 7-11 o'clock. Overall wound appears to be darker pink/red towards posterior region. Tolerated debridement better compared to previous sessions. Continued with santyl, profore lite and number 5 netting.    Wound Therapy - Functional Problem List difficulty putting a sock on, pain upon ambulation.    Factors Delaying/Impairing Wound Healing Infection - systemic/local;Multiple medical problems;Vascular compromise;Tobacco use    Hydrotherapy Plan Debridement;Dressing change;Patient/family education;Pulsatile lavage with suction    Wound Therapy - Frequency 3X / week    Wound Therapy - Current Recommendations PT  Wound Plan f/u with Referral sent for ABI.  continue with santyl and debridement    Dressing  santyl,  2x2 ,   profore lite                     PT Short Term Goals - 03/05/20 1609      PT SHORT TERM GOAL #1   Title Pt wound to be 100% granulated to prevent infection.    Time 3    Period Weeks    Status New    Target Date 03/26/20      PT SHORT TERM GOAL #2   Title PT pain to be no greater than a 5/10 in her Rt ankle to allow pt to ambulate without pain    Time 3    Period Weeks    Status New             PT Long Term Goals - 03/05/20 1618      PT  LONG TERM GOAL #1   Title Rt ankle wound to be healed    Time 6    Period Weeks    Status New    Target Date 04/16/20      PT LONG TERM GOAL #2   Title PT to have no pain in her Rt ankle when walking.    Time 6    Period Weeks    Status New      PT LONG TERM GOAL #3   Title PT to have obtained and daughter to be able to [facility] and Doff compression garments to prevent future wounds.    Time 6    Period Weeks    Status New                 Plan - 04/08/20 1434    Clinical Impression Statement see above    Personal Factors and Comorbidities Age;Comorbidity 3+;Fitness;Time since onset of injury/illness/exacerbation    Comorbidities COPD, CAD, THRx 2; varicosities    Examination-Activity Limitations Bathing;Dressing;Locomotion Level    Examination-Participation Restrictions Other    Stability/Clinical Decision Making Evolving/Moderate complexity    Rehab Potential Good    PT Frequency 3x / week    PT Duration 6 weeks    PT Treatment/Interventions Other (comment);Compression bandaging;Patient/family education   debridement,  santyl for chemical debridement.   PT Next Visit Plan continue debridment and dreassing change as indicated.           Patient will benefit from skilled therapeutic intervention in order to improve the following deficits and impairments:  Pain,Difficulty walking,Decreased skin integrity  Visit Diagnosis: Pain in right lower leg  Chronic ulcer of right ankle, unspecified ulcer stage ([place])  Difficulty in walking, not elsewhere classified     Problem List Patient Active Problem List   Diagnosis Date Noted  . ATHEROSLERO NATV ART EXTREM W/INTERMIT CLAUDICAT 06/17/2009    3:33 PM, 04/08/20 Jerene Pitch, DPT Physical Therapy with [facility]  [phone] office   [place] [address] [place], [place], [address] Phone: [phone]   Fax:  [phone]  Name: Tina Tucker MRN: [id number] Date of Birth: [birth year]

## 2020-04-10 ENCOUNTER — Other Ambulatory Visit: Payer: Self-pay

## 2020-04-10 ENCOUNTER — Ambulatory Visit (HOSPITAL_COMMUNITY): Payer: Medicare HMO | Admitting: Physical Therapy

## 2020-04-10 DIAGNOSIS — L97319 Non-pressure chronic ulcer of right ankle with unspecified severity: Secondary | ICD-10-CM | POA: Diagnosis not present

## 2020-04-10 DIAGNOSIS — M79661 Pain in right lower leg: Secondary | ICD-10-CM

## 2020-04-10 DIAGNOSIS — R262 Difficulty in walking, not elsewhere classified: Secondary | ICD-10-CM | POA: Diagnosis not present

## 2020-04-10 NOTE — Therapy (Signed)
Tina [place], [place], [address] Phone: [phone]   Fax:  [phone]  Wound Care Therapy  Patient Details  Name: Tina Tucker MRN: [id number] Date of Birth: July 13, 1932 Referring Provider (PT): xjae Hasanaj   Encounter Date: 04/10/2020   PT End of Session - 04/10/20 1639    Visit Number 14    Number of Visits 24    Date for PT Re-Evaluation 05/03/20    Authorization Type [employer] authorized 12 more visits 4/1 /4/30    Authorization - Visit Number 3    Authorization - Number of Visits 12    PT Start Time 1324    PT Stop Time 1435    PT Time Calculation (min) 30 min           No past medical history on file.  No past surgical history on file.  There were no vitals filed for this visit.               Wound Therapy - 04/10/20 0001    Subjective No reports of pain except during debridement.    Patient and Family Stated Goals wound to heal.    Date of Onset 11/18/19    Prior Treatments self care, MD and antibiotics    Evaluation and Treatment Procedures Explained to Patient/Family Yes    Evaluation and Treatment Procedures agreed to    Wound Properties Date First Assessed: 03/05/20 Time First Assessed: 1540 Location: Ankle Location Orientation: Right;Medial Wound Description (Comments): circular in nature with yellow eschar; LE in slightly indurated Present on Admission: Yes   Dressing Type Compression wrap;Santyl    Dressing Changed Changed    Dressing Status Old drainage    Dressing Change Frequency PRN    Site / Wound Assessment Pink;Yellow    % Wound base Red or Granulating 80%    % Wound base Yellow/Fibrinous Exudate 20%    Peri-wound Assessment Erythema (blanchable)    Undermining (cm) 7-11    Margins Unattached edges (unapproximated)    Drainage Amount Minimal    Drainage Description Serous    Selective Debridement - Location wound bed    Selective Debridement - Tools Used  Forceps;Scalpel    Selective Debridement - Tissue Removed slough    Wound Therapy - Clinical Statement Pt states they called about the ABI but said that they would call back to schedule June 6th, therapist encouraged daugher to call MD office and confirm that this was correct.  Wound itself has significantly increased in granulation.  Therapist had a trial of returning to [place] due to increased granulation as well as decreasing the compression to just gauze and coban to see if this assists in the speed of healingl.    Wound Therapy - Functional Problem List difficulty putting a sock on, pain upon ambulation.    Factors Delaying/Impairing Wound Healing Infection - systemic/local;Multiple medical problems;Vascular compromise;Tobacco use    Hydrotherapy Plan Debridement;Dressing change;Patient/family education;Pulsatile lavage with suction    Wound Therapy - Frequency 3X / week    Wound Therapy - Current Recommendations PT    Wound Plan f/u with Referral sent for ABI.  continue with santyl and debridement    Dressing  medihoney, 2x2, gauze and coban.                     PT Short Term Goals - 03/05/20 1609      PT SHORT TERM GOAL #1   Title Pt wound  to be 100% granulated to prevent infection.    Time 3    Period Weeks    Status New    Target Date 03/26/20      PT SHORT TERM GOAL #2   Title PT pain to be no greater than a 5/10 in her Rt ankle to allow pt to ambulate without pain    Time 3    Period Weeks    Status New             PT Long Term Goals - 03/05/20 1618      PT LONG TERM GOAL #1   Title Rt ankle wound to be healed    Time 6    Period Weeks    Status New    Target Date 04/16/20      PT LONG TERM GOAL #2   Title PT to have no pain in her Rt ankle when walking.    Time 6    Period Weeks    Status New      PT LONG TERM GOAL #3   Title PT to have obtained and daughter to be able to [facility] and Doff compression garments to prevent future wounds.    Time 6     Period Weeks    Status New                 Plan - 04/10/20 1647    Clinical Impression Statement see above    Personal Factors and Comorbidities Age;Comorbidity 3+;Fitness;Time since onset of injury/illness/exacerbation    Comorbidities COPD, CAD, THRx 2; varicosities    Examination-Activity Limitations Bathing;Dressing;Locomotion Level    Examination-Participation Restrictions Other    Stability/Clinical Decision Making Evolving/Moderate complexity    Rehab Potential Good    PT Frequency 3x / week    PT Duration 6 weeks    PT Treatment/Interventions Other (comment);Compression bandaging;Patient/family education   debridement,  santyl for chemical debridement.   PT Next Visit Plan assess how decreasing compression and returning to [place] does for the wound. continue debridment and dreassing change as indicated.           Patient will benefit from skilled therapeutic intervention in order to improve the following deficits and impairments:  Pain,Difficulty walking,Decreased skin integrity  Visit Diagnosis: Pain in right lower leg  Chronic ulcer of right ankle, unspecified ulcer stage ([place])  Difficulty in walking, not elsewhere classified     Problem List Patient Active Problem List   Diagnosis Date Noted  . ATHEROSLERO NATV ART EXTREM W/INTERMIT CLAUDICAT 06/17/2009  Tina Tucker, PT CLT [phone] 04/10/2020, 4:48 PM  [place] [address] [place], [place], [address] Phone: [phone]   Fax:  [phone]  Name: Tina Tucker MRN: [id number] Date of Birth: [birth year]

## 2020-04-11 ENCOUNTER — Encounter (HOSPITAL_COMMUNITY): Payer: Self-pay

## 2020-04-11 ENCOUNTER — Ambulatory Visit (HOSPITAL_COMMUNITY): Payer: Medicare HMO

## 2020-04-11 DIAGNOSIS — M79661 Pain in right lower leg: Secondary | ICD-10-CM | POA: Diagnosis not present

## 2020-04-11 DIAGNOSIS — L97319 Non-pressure chronic ulcer of right ankle with unspecified severity: Secondary | ICD-10-CM | POA: Diagnosis not present

## 2020-04-11 DIAGNOSIS — R262 Difficulty in walking, not elsewhere classified: Secondary | ICD-10-CM

## 2020-04-11 NOTE — Therapy (Signed)
[place], [place], [address] Phone: [phone]   Fax:  [phone]  Wound Care Therapy  Patient Details  Name: Tina Tucker MRN: [id number] Date of Birth: October 28, 1932 Referring Provider (PT): xjae Hasanaj   Encounter Date: 04/11/2020   PT End of Session - 04/11/20 1409    Visit Number 15    Number of Visits 24    Date for PT Re-Evaluation 05/03/20    Authorization Type [employer] authorized 12 more visits 4/1 /4/30    Authorization - Visit Number 4    Authorization - Number of Visits 12    Progress Note Due on Visit 20    PT Start Time 3976   Restroom break at beginning of session   PT Stop Time 1400    PT Time Calculation (min) 35 min    Activity Tolerance Patient limited by pain;Patient tolerated treatment well   Pain wiht debridement   Behavior During Therapy [facility] for tasks assessed/performed           History reviewed. No pertinent past medical history.  History reviewed. No pertinent surgical history.  There were no vitals filed for this visit.    Subjective Assessment - 04/11/20 1404    Subjective Pt stated she didn't have good night sleep, reoprts some periodic burning.  No reports of pain currently.    Patient is accompained by: Family member   daughter   Currently in Pain? No/denies                     Wound Therapy - 04/11/20 0001    Subjective Pt stated she didn't have good night sleep, reoprts some periodic burning.  No reports of pain currently.    Patient and Family Stated Goals wound to heal.    Date of Onset 11/18/19    Prior Treatments self care, MD and antibiotics    Pain Scale 0-10    Evaluation and Treatment Procedures Explained to Patient/Family Yes    Evaluation and Treatment Procedures agreed to    Wound Properties Date First Assessed: 03/05/20 Time First Assessed: 1540 Location: Ankle Location Orientation: Right;Medial Wound Description (Comments): circular in  nature with yellow eschar; LE in slightly indurated Present on Admission: Yes   Wound Image View All Images View Images    Dressing Type Compression wrap   medihoney, kerlix, coban   Dressing Changed Changed    Dressing Status Old drainage    Dressing Change Frequency PRN    Site / Wound Assessment Pink;Yellow    % Wound base Red or Granulating 80%    % Wound base Yellow/Fibrinous Exudate 20%    Peri-wound Assessment Erythema (blanchable)    Wound Length (cm) 0.7 cm    Wound Width (cm) 0.8 cm    Wound Depth (cm) 0.2 cm   decreased depth on distal aspect of wound, improved granulation   Wound Volume (cm^3) 0.11 cm^3    Wound Surface Area (cm^2) 0.56 cm^2    Undermining (cm) 7-11 by .1cm    Margins Unattached edges (unapproximated)    Drainage Amount Minimal    Drainage Description Serous    Treatment Cleansed;Debridement (Selective)    Selective Debridement - Location wound bed    Selective Debridement - Tools Used Forceps;Scalpel    Selective Debridement - Tissue Removed slough    Wound Therapy - Clinical Statement Wound is progressing well with improved granulation tissue following debridement.  Edema controlled well with kerlix  and coban.  Continued wiht medihoney to address adhered slough on wound bed.  Daughter reports she called MD office and have ABI scheduled for 05/01/20.    Wound Therapy - Functional Problem List difficulty putting a sock on, pain upon ambulation.    Factors Delaying/Impairing Wound Healing Infection - systemic/local;Multiple medical problems;Vascular compromise;Tobacco use    Hydrotherapy Plan Debridement;Dressing change;Patient/family education;Pulsatile lavage with suction    Wound Therapy - Frequency 3X / week    Wound Therapy - Current Recommendations PT    Wound Plan ABI scheduled for 05/01/20.  continue with santyl and debridement    Dressing  medihoney, 2x2, gauze and coban.                     PT Short Term Goals - 03/05/20 1609      PT  SHORT TERM GOAL #1   Title Pt wound to be 100% granulated to prevent infection.    Time 3    Period Weeks    Status New    Target Date 03/26/20      PT SHORT TERM GOAL #2   Title PT pain to be no greater than a 5/10 in her Rt ankle to allow pt to ambulate without pain    Time 3    Period Weeks    Status New             PT Long Term Goals - 03/05/20 1618      PT LONG TERM GOAL #1   Title Rt ankle wound to be healed    Time 6    Period Weeks    Status New    Target Date 04/16/20      PT LONG TERM GOAL #2   Title PT to have no pain in her Rt ankle when walking.    Time 6    Period Weeks    Status New      PT LONG TERM GOAL #3   Title PT to have obtained and daughter to be able to [facility] and Doff compression garments to prevent future wounds.    Time 6    Period Weeks    Status New                  Patient will benefit from skilled therapeutic intervention in order to improve the following deficits and impairments:     Visit Diagnosis: Pain in right lower leg  Chronic ulcer of right ankle, unspecified ulcer stage ([place])  Difficulty in walking, not elsewhere classified     Problem List Patient Active Problem List   Diagnosis Date Noted  . ATHEROSLERO NATV ART EXTREM W/INTERMIT CLAUDICAT 06/17/2009   [name], LPTA/CLT; CBIS [phone]  Aldona Lento 04/11/2020, 2:11 PM  [place], [place], [address] Phone: [phone]   Fax:  [phone]  Name: Tina Tucker MRN: [id number] Date of Birth: [birth year]

## 2020-04-15 ENCOUNTER — Other Ambulatory Visit: Payer: Self-pay

## 2020-04-15 ENCOUNTER — Ambulatory Visit (HOSPITAL_COMMUNITY): Payer: Medicare HMO | Admitting: Physical Therapy

## 2020-04-15 DIAGNOSIS — R262 Difficulty in walking, not elsewhere classified: Secondary | ICD-10-CM

## 2020-04-15 DIAGNOSIS — L97319 Non-pressure chronic ulcer of right ankle with unspecified severity: Secondary | ICD-10-CM

## 2020-04-15 DIAGNOSIS — M79661 Pain in right lower leg: Secondary | ICD-10-CM

## 2020-04-15 NOTE — Therapy (Signed)
[place], [place], [address] Phone: [phone]   Fax:  [phone]  Wound Care Therapy  Patient Details  Name: Tina Tucker MRN: [id number] Date of Birth: 1932-04-07 Referring Provider (PT): xjae Hasanaj   Encounter Date: 04/15/2020   PT End of Session - 04/15/20 1437    Visit Number 16    Number of Visits 24    Date for PT Re-Evaluation 05/03/20    Authorization Type [employer] authorized 12 more visits 4/1 /4/30    Authorization - Visit Number 5    Authorization - Number of Visits 12    Progress Note Due on Visit 20    PT Start Time 1320    PT Stop Time 1400    PT Time Calculation (min) 40 min    Activity Tolerance Patient limited by pain;Patient tolerated treatment well   Pain wiht debridement   Behavior During Therapy [facility] for tasks assessed/performed           No past medical history on file.  No past surgical history on file.  There were no vitals filed for this visit.    Subjective Assessment - 04/15/20 1431    Subjective Pt states it was a little tight last visit.  Accompanied by daughter.                     Wound Therapy - 04/15/20 1432    Subjective pt reports wraps were tight.  No other issues    Patient and Family Stated Goals wound to heal.    Date of Onset 11/18/19    Prior Treatments self care, MD and antibiotics    Evaluation and Treatment Procedures Explained to Patient/Family Yes    Evaluation and Treatment Procedures agreed to    Wound Properties Date First Assessed: 03/05/20 Time First Assessed: 1540 Location: Ankle Location Orientation: Right;Medial Wound Description (Comments): circular in nature with yellow eschar; LE in slightly indurated Present on Admission: Yes   Dressing Type Compression wrap    Dressing Changed Changed    Dressing Status Old drainage    Dressing Change Frequency PRN    Site / Wound Assessment Yellow    % Wound base Red or Granulating 20%    20% after debridement, 0% initially   % Wound base Yellow/Fibrinous Exudate 80%    Peri-wound Assessment Erythema (blanchable)    Drainage Amount Minimal    Drainage Description Serous    Treatment Cleansed;Debridement (Selective)    Selective Debridement - Location wound bed and perimeter    Selective Debridement - Tools Used Forceps;Scalpel    Selective Debridement - Tissue Removed slough    Wound Therapy - Clinical Statement No granulation present today and only minimal recovered around borders following debridement.  Resumed santyl this session to help reduce adhesive necrotic tissue.  Daughter with questions regarding further woundcare scheduling as Thursday is last one.  Informed we would make the decision Thursday based on further need of santyl and if needs 2 or 3 time a week treatments.    Wound Therapy - Functional Problem List difficulty putting a sock on, pain upon ambulation.    Factors Delaying/Impairing Wound Healing Infection - systemic/local;Multiple medical problems;Vascular compromise;Tobacco use    Hydrotherapy Plan Debridement;Dressing change;Patient/family education;Pulsatile lavage with suction    Wound Therapy - Frequency 3X / week    Wound Therapy - Current Recommendations PT    Wound Plan ABI scheduled for 05/01/20.  continue with appropriate  dressing and debridement    Dressing  santyl, #5 lymph netting, 2x2 gauze, kerlix, cotton around ankle, coban and #5 netting over.                     PT Short Term Goals - 03/05/20 1609      PT SHORT TERM GOAL #1   Title Pt wound to be 100% granulated to prevent infection.    Time 3    Period Weeks    Status New    Target Date 03/26/20      PT SHORT TERM GOAL #2   Title PT pain to be no greater than a 5/10 in her Rt ankle to allow pt to ambulate without pain    Time 3    Period Weeks    Status New             PT Long Term Goals - 03/05/20 1618      PT LONG TERM GOAL #1   Title Rt ankle wound to be  healed    Time 6    Period Weeks    Status New    Target Date 04/16/20      PT LONG TERM GOAL #2   Title PT to have no pain in her Rt ankle when walking.    Time 6    Period Weeks    Status New      PT LONG TERM GOAL #3   Title PT to have obtained and daughter to be able to [facility] and Doff compression garments to prevent future wounds.    Time 6    Period Weeks    Status New                  Patient will benefit from skilled therapeutic intervention in order to improve the following deficits and impairments:     Visit Diagnosis: No diagnosis found.     Problem List Patient Active Problem List   Diagnosis Date Noted  . ATHEROSLERO NATV ART EXTREM W/INTERMIT CLAUDICAT 06/17/2009   Teena Irani, PTA/CLT [phone]  Teena Irani 04/15/2020, 2:38 PM  [place] [address] [place], [place], [address] Phone: [phone]   Fax:  [phone]  Name: Tina Tucker MRN: [id number] Date of Birth: 06/08/1932

## 2020-04-17 ENCOUNTER — Encounter (HOSPITAL_COMMUNITY): Payer: Self-pay | Admitting: Physical Therapy

## 2020-04-17 ENCOUNTER — Other Ambulatory Visit: Payer: Self-pay

## 2020-04-17 ENCOUNTER — Ambulatory Visit (HOSPITAL_COMMUNITY): Payer: Medicare HMO | Admitting: Physical Therapy

## 2020-04-17 DIAGNOSIS — M79661 Pain in right lower leg: Secondary | ICD-10-CM

## 2020-04-17 DIAGNOSIS — L97319 Non-pressure chronic ulcer of right ankle with unspecified severity: Secondary | ICD-10-CM | POA: Diagnosis not present

## 2020-04-17 DIAGNOSIS — R262 Difficulty in walking, not elsewhere classified: Secondary | ICD-10-CM | POA: Diagnosis not present

## 2020-04-17 NOTE — Therapy (Signed)
[place], [place], [address] Phone: [phone]   Fax:  [phone]  Wound Care Therapy  Patient Details  Name: Tina Tucker MRN: [id number] Date of Birth: 1932/02/20 Referring Provider (PT): Wyn Forster   Encounter Date: 04/17/2020   PT End of Session - 04/17/20 1615    Visit Number 17    Number of Visits 24    Date for PT Re-Evaluation 05/03/20    Authorization Type [employer] authorized 12 more visits 4/1 /4/30    Authorization - Visit Number 6    Authorization - Number of Visits 12    Progress Note Due on Visit 20    PT Start Time 1400    PT Stop Time 1435    PT Time Calculation (min) 35 min    Activity Tolerance Patient limited by pain;Patient tolerated treatment well   Pain wiht debridement   Behavior During Therapy [facility] for tasks assessed/performed           History reviewed. No pertinent past medical history.  History reviewed. No pertinent surgical history.  There were no vitals filed for this visit.               Wound Therapy - 04/17/20 0001    Subjective Pt has not complaint    Patient and Family Stated Goals wound to heal.    Date of Onset 11/18/19    Prior Treatments self care, MD and antibiotics    Evaluation and Treatment Procedures Explained to Patient/Family Yes    Evaluation and Treatment Procedures agreed to    Wound Properties Date First Assessed: 03/05/20 Time First Assessed: 1540 Location: Ankle Location Orientation: Right;Medial Wound Description (Comments): circular in nature with yellow eschar; LE in slightly indurated Present on Admission: Yes   Dressing Type Compression wrap    Dressing Changed Changed    Dressing Status Old drainage    Dressing Change Frequency PRN    Site / Wound Assessment Pink;Yellow    % Wound base Red or Granulating 80%   last update on 3/25: 40% granulated   % Wound base Yellow/Fibrinous Exudate 20%   last update was 60% slough   Wound  Length (cm) 0.5 cm   was .8   Wound Width (cm) 1 cm   was 1   Wound Depth (cm) 0.2 cm    Wound Volume (cm^3) 0.1 cm^3    Wound Surface Area (cm^2) 0.5 cm^2    Undermining (cm) throughout    Margins Epibole (rolled edges)    Drainage Amount Minimal    Drainage Description Serous    Treatment Cleansed;Debridement (Selective)    Selective Debridement - Location wound bed and perimeter    Selective Debridement - Tools Used Forceps;Scalpel    Selective Debridement - Tissue Removed slough    Wound Therapy - Clinical Statement Pt wound is very dry, noted undermining continues.   PT is scheduled for her ABI on 4/26 but slow healing may be due to smoking a pack of cigarettes a day.  Pt states leg feels better with less compression therefore a trial of no compression given in case decreased wound healing is due to decreased arterial flow.    Wound Therapy - Functional Problem List difficulty putting a sock on, pain upon ambulation.    Factors Delaying/Impairing Wound Healing Infection - systemic/local;Multiple medical problems;Vascular compromise;Tobacco use    Hydrotherapy Plan Debridement;Dressing change;Patient/family education;Pulsatile lavage with suction    Wound Therapy - Frequency 3X /  week    Wound Therapy - Current Recommendations PT    Wound Plan Assess how pt wound did with no compression.  Assess wound to see if medihoney or santyl is more indicative.  Therapist will increase treatment to three times a week so we are more consistent with santyl if wound granualtion does not stay at 80% or greater.    Dressing  medihoney, xeroform to keep moist, 2x2 and kerlix.                     PT Short Term Goals - 03/05/20 1609      PT SHORT TERM GOAL #1   Title Pt wound to be 100% granulated to prevent infection.    Time 3    Period Weeks    Status New    Target Date 03/26/20      PT SHORT TERM GOAL #2   Title PT pain to be no greater than a 5/10 in her Rt ankle to allow pt to  ambulate without pain    Time 3    Period Weeks    Status New             PT Long Term Goals - 03/05/20 1618      PT LONG TERM GOAL #1   Title Rt ankle wound to be healed    Time 6    Period Weeks    Status New    Target Date 04/16/20      PT LONG TERM GOAL #2   Title PT to have no pain in her Rt ankle when walking.    Time 6    Period Weeks    Status New      PT LONG TERM GOAL #3   Title PT to have obtained and daughter to be able to [facility] and Doff compression garments to prevent future wounds.    Time 6    Period Weeks    Status New                 Plan - 04/17/20 1616    Clinical Impression Statement see above    Personal Factors and Comorbidities Age;Comorbidity 3+;Fitness;Time since onset of injury/illness/exacerbation    Comorbidities COPD, CAD, THRx 2; varicosities    Examination-Activity Limitations Bathing;Dressing;Locomotion Level    Examination-Participation Restrictions Other    Stability/Clinical Decision Making Evolving/Moderate complexity    Clinical Decision Making Moderate    Rehab Potential Good    PT Frequency 3x / week    PT Duration 6 weeks    PT Treatment/Interventions Other (comment);Compression bandaging;Patient/family education   debridement,  santyl for chemical debridement.   PT Next Visit Plan assess how decreasing compression and returning to [place] does for the wound. continue debridment and dreassing change as indicated.           Patient will benefit from skilled therapeutic intervention in order to improve the following deficits and impairments:  Pain,Difficulty walking,Decreased skin integrity  Visit Diagnosis: Chronic ulcer of right ankle, unspecified ulcer stage (HCC)  Pain in right lower leg  Difficulty in walking, not elsewhere classified     Problem List Patient Active Problem List   Diagnosis Date Noted  . ATHEROSLERO NATV ART EXTREM W/INTERMIT CLAUDICAT 06/17/2009    Rayetta Humphrey, PT  CLT [phone] 04/17/2020, 4:17 PM  [place] [place] [address] [place], [place], [address] Phone: [phone]   Fax:  [phone]  Name: Tina Tucker MRN:  [id number] Date of Birth: [birth year]

## 2020-04-21 ENCOUNTER — Other Ambulatory Visit: Payer: Self-pay

## 2020-04-21 ENCOUNTER — Ambulatory Visit (HOSPITAL_COMMUNITY): Payer: Medicare HMO | Admitting: Physical Therapy

## 2020-04-21 DIAGNOSIS — M79661 Pain in right lower leg: Secondary | ICD-10-CM | POA: Diagnosis not present

## 2020-04-21 DIAGNOSIS — R262 Difficulty in walking, not elsewhere classified: Secondary | ICD-10-CM

## 2020-04-21 DIAGNOSIS — L97319 Non-pressure chronic ulcer of right ankle with unspecified severity: Secondary | ICD-10-CM | POA: Diagnosis not present

## 2020-04-21 NOTE — Therapy (Signed)
[place], [place], [address] Phone: [phone]   Fax:  [phone]  Wound Care Therapy  Patient Details  Name: Tina Tucker MRN: [id number] Date of Birth: 1932-07-27 Referring Provider (PT): xjae Hasanaj   Encounter Date: 04/21/2020   PT End of Session - 04/21/20 0932    Visit Number 18    Number of Visits 24    Date for PT Re-Evaluation 05/03/20    Authorization Type [employer] authorized 12 more visits 4/1 /4/30    Authorization - Visit Number 7    Authorization - Number of Visits 12    Progress Note Due on Visit 20    PT Start Time 0833    PT Stop Time 0902    PT Time Calculation (min) 29 min    Activity Tolerance Patient limited by pain;Patient tolerated treatment well   Pain wiht debridement   Behavior During Therapy [facility] for tasks assessed/performed           No past medical history on file.  No past surgical history on file.  There were no vitals filed for this visit.               Wound Therapy - 04/21/20 0907    Subjective pt states more comfort without the compression.    Patient and Family Stated Goals wound to heal.    Date of Onset 11/18/19    Prior Treatments self care, MD and antibiotics    Evaluation and Treatment Procedures Explained to Patient/Family Yes    Evaluation and Treatment Procedures agreed to    Wound Properties Date First Assessed: 03/05/20 Time First Assessed: 1540 Location: Ankle Location Orientation: Right;Medial Wound Description (Comments): circular in nature with yellow eschar; LE in slightly indurated Present on Admission: Yes   Wound Image View All Images View Images    Dressing Type Gauze (Comment)    Dressing Changed Changed    Dressing Status Old drainage    Dressing Change Frequency PRN    Site / Wound Assessment Red;Yellow    % Wound base Red or Granulating 80%   after debridement   % Wound base Yellow/Fibrinous Exudate 20%   after debridement;  was 100% covered upon dressing removal   Drainage Amount Minimal    Drainage Description Serous    Treatment Debridement (Selective)    Selective Debridement - Location wound bed and perimeter    Selective Debridement - Tools Used Forceps;Scalpel    Selective Debridement - Tissue Removed slough    Wound Therapy - Clinical Statement Wound photographed today.  Pt reported improvement without using the compression.  Covered 100% with slough when removed dressing.  Able to remove 80% of the slough to reveal granulation beneath. Applied santyl to one small area remaining with adherent slough and used medihoney gauze over that to keep the wound moist.  applied vaseline to perimeter as well.  continued with kerlix and netting without compression.    Wound Therapy - Functional Problem List difficulty putting a sock on, pain upon ambulation.    Factors Delaying/Impairing Wound Healing Infection - systemic/local;Multiple medical problems;Vascular compromise;Tobacco use    Hydrotherapy Plan Debridement;Dressing change;Patient/family education;Pulsatile lavage with suction    Wound Therapy - Frequency 3X / week    Wound Therapy - Current Recommendations PT    Wound Plan continue to assess how pt wound does with currently dressing of medihoney/santyl and adjust as indicated according to slough/granualtion and approximation.  Measure and photograph  weekly.    Dressing  santyl to 20%, medihoney, 2x2 and kerlix.                     PT Short Term Goals - 03/05/20 1609      PT SHORT TERM GOAL #1   Title Pt wound to be 100% granulated to prevent infection.    Time 3    Period Weeks    Status New    Target Date 03/26/20      PT SHORT TERM GOAL #2   Title PT pain to be no greater than a 5/10 in her Rt ankle to allow pt to ambulate without pain    Time 3    Period Weeks    Status New             PT Long Term Goals - 03/05/20 1618      PT LONG TERM GOAL #1   Title Rt ankle wound to be  healed    Time 6    Period Weeks    Status New    Target Date 04/16/20      PT LONG TERM GOAL #2   Title PT to have no pain in her Rt ankle when walking.    Time 6    Period Weeks    Status New      PT LONG TERM GOAL #3   Title PT to have obtained and daughter to be able to [facility] and Doff compression garments to prevent future wounds.    Time 6    Period Weeks    Status New                  Patient will benefit from skilled therapeutic intervention in order to improve the following deficits and impairments:     Visit Diagnosis: Chronic ulcer of right ankle, unspecified ulcer stage (HCC)  Pain in right lower leg  Difficulty in walking, not elsewhere classified     Problem List Patient Active Problem List   Diagnosis Date Noted  . ATHEROSLERO NATV ART EXTREM W/INTERMIT CLAUDICAT 06/17/2009   Tina Tucker, PTA/CLT [phone]  Tina Tucker 04/21/2020, 9:33 AM  [place] [address] [place], [place], [address] Phone: [phone]   Fax:  [phone]  Name: Tina Tucker MRN: [id number] Date of Birth: Jan 24, 1932

## 2020-04-23 ENCOUNTER — Other Ambulatory Visit: Payer: Self-pay

## 2020-04-23 ENCOUNTER — Encounter (HOSPITAL_COMMUNITY): Payer: Self-pay | Admitting: Physical Therapy

## 2020-04-23 ENCOUNTER — Ambulatory Visit (HOSPITAL_COMMUNITY): Payer: Medicare HMO | Admitting: Physical Therapy

## 2020-04-23 DIAGNOSIS — M79661 Pain in right lower leg: Secondary | ICD-10-CM | POA: Diagnosis not present

## 2020-04-23 DIAGNOSIS — R262 Difficulty in walking, not elsewhere classified: Secondary | ICD-10-CM

## 2020-04-23 DIAGNOSIS — L97319 Non-pressure chronic ulcer of right ankle with unspecified severity: Secondary | ICD-10-CM | POA: Diagnosis not present

## 2020-04-23 NOTE — Therapy (Signed)
[place], [place], [address] Phone: [phone]   Fax:  [phone]  Wound Care Therapy  Patient Details  Name: Tina Tucker MRN: [id number] Date of Birth: 01-27-1932 Referring Provider (PT): Wyn Forster   Encounter Date: 04/23/2020   PT End of Session - 04/23/20 1516    Visit Number 19    Number of Visits 24    Date for PT Re-Evaluation 05/03/20    Authorization Type [employer] authorized 12 more visits 4/1 /4/30    Authorization - Visit Number 7    Authorization - Number of Visits 12    Progress Note Due on Visit 20    PT Start Time 9935    PT Stop Time 1508    PT Time Calculation (min) 23 min    Activity Tolerance Patient limited by pain;Patient tolerated treatment well   Pain wiht debridement   Behavior During Therapy [facility] for tasks assessed/performed           History reviewed. No pertinent past medical history.  History reviewed. No pertinent surgical history.  There were no vitals filed for this visit.               Wound Therapy - 04/23/20 0001    Subjective pt states more comfort without the compression.    Patient and Family Stated Goals wound to heal.    Date of Onset 11/18/19    Prior Treatments self care, MD and antibiotics    Evaluation and Treatment Procedures Explained to Patient/Family Yes    Evaluation and Treatment Procedures agreed to    Wound Properties Date First Assessed: 03/05/20 Time First Assessed: 1540 Location: Ankle Location Orientation: Right;Medial Wound Description (Comments): circular in nature with yellow eschar; LE in slightly indurated Present on Admission: Yes   Dressing Type Gauze (Comment)    Dressing Changed Changed    Dressing Status Old drainage    Dressing Change Frequency PRN    % Wound base Red or Granulating 90%   after debridement   % Wound base Yellow/Fibrinous Exudate 10%    Undermining (cm) throughout    Margins Epibole (rolled edges)     Drainage Amount Minimal    Drainage Description Serous    Treatment Debridement (Selective)    Selective Debridement - Location wound bed and epiboled edges    Selective Debridement - Tools Used Forceps;Scalpel    Selective Debridement - Tissue Removed slough    Wound Therapy - Clinical Statement Noted increased swelling without compression therefore returned to profore lite.  Slough is not nearly as adherent or dry making debridement much easier, however it continues to be very painful for the pt.    Wound Therapy - Functional Problem List difficulty putting a sock on, pain upon ambulation.    Factors Delaying/Impairing Wound Healing Infection - systemic/local;Multiple medical problems;Vascular compromise;Tobacco use    Hydrotherapy Plan Re assess and request more visits [facility]. Debridement;Dressing change;Patient/family education;Pulsatile lavage with suction    Wound Therapy - Frequency 3X / week    Wound Therapy - Current Recommendations PT    Wound Plan Return to profore lite,    Dressing  santyl f/b 2x2 and profore lite with netting                     PT Short Term Goals - 03/05/20 1609      PT SHORT TERM GOAL #1   Title Pt wound to be 100% granulated  to prevent infection.    Time 3    Period Weeks    Status New    Target Date 03/26/20      PT SHORT TERM GOAL #2   Title PT pain to be no greater than a 5/10 in her Rt ankle to allow pt to ambulate without pain    Time 3    Period Weeks    Status New             PT Long Term Goals - 03/05/20 1618      PT LONG TERM GOAL #1   Title Rt ankle wound to be healed    Time 6    Period Weeks    Status New    Target Date 04/16/20      PT LONG TERM GOAL #2   Title PT to have no pain in her Rt ankle when walking.    Time 6    Period Weeks    Status New      PT LONG TERM GOAL #3   Title PT to have obtained and daughter to be able to [facility] and Doff compression garments to prevent future wounds.    Time 6     Period Weeks    Status New                 Plan - 04/23/20 1517    Clinical Impression Statement see above    Personal Factors and Comorbidities Age;Comorbidity 3+;Fitness;Time since onset of injury/illness/exacerbation    Comorbidities COPD, CAD, THRx 2; varicosities    Examination-Activity Limitations Bathing;Dressing;Locomotion Level    Examination-Participation Restrictions Other    Stability/Clinical Decision Making Evolving/Moderate complexity    Rehab Potential Good    PT Frequency 3x / week    PT Duration 6 weeks    PT Treatment/Interventions Other (comment);Compression bandaging;Patient/family education   debridement,  santyl for chemical debridement.   PT Next Visit Plan PT will need reassessment measure wound and granulation compare to visit 10 and request for more visits via [facility].           Patient will benefit from skilled therapeutic intervention in order to improve the following deficits and impairments:  Pain,Difficulty walking,Decreased skin integrity  Visit Diagnosis: Chronic ulcer of right ankle, unspecified ulcer stage (HCC)  Pain in right lower leg  Difficulty in walking, not elsewhere classified     Problem List Patient Active Problem List   Diagnosis Date Noted  . ATHEROSLERO NATV ART EXTREM W/INTERMIT CLAUDICAT 06/17/2009  Tina Tucker, PT CLT [phone] 04/23/2020, 3:19 PM  [place] [address] [place], [place], [address] Phone: [phone]   Fax:  [phone]  Name: Tina Tucker MRN: [id number] Date of Birth: Jun 29, 1932

## 2020-04-25 ENCOUNTER — Other Ambulatory Visit: Payer: Self-pay

## 2020-04-25 ENCOUNTER — Encounter (HOSPITAL_COMMUNITY): Payer: Self-pay

## 2020-04-25 ENCOUNTER — Ambulatory Visit (HOSPITAL_COMMUNITY): Payer: Medicare HMO

## 2020-04-25 DIAGNOSIS — M79661 Pain in right lower leg: Secondary | ICD-10-CM

## 2020-04-25 DIAGNOSIS — L97319 Non-pressure chronic ulcer of right ankle with unspecified severity: Secondary | ICD-10-CM

## 2020-04-25 DIAGNOSIS — R262 Difficulty in walking, not elsewhere classified: Secondary | ICD-10-CM

## 2020-04-25 NOTE — Therapy (Addendum)
[facility] [address] [place], [place], [address] Phone: [phone]   Fax:  [phone]  Wound Care Therapy Progress Note Reporting Period 03/28/20 to 04/25/20  See note below for Objective Data and Assessment of Progress/Goals.      Patient Details  Name: Tina Tucker MRN: [id number] Date of Birth: December 25, 1932 Referring Provider (PT): xjae Hasanaj   Encounter Date: 04/25/2020   PT End of Session - 04/25/20 1440    Visit Number 20    Number of Visits 24    Date for PT Re-Evaluation 05/03/20    Authorization Type [employer] authorized 12 more visits 4/1 /4/30    Authorization Time Period Requested 12 additional apts on 4/22, visit #20    Authorization - Visit Number 8    Authorization - Number of Visits 12    Progress Note Due on Visit 30    PT Start Time 1135    PT Stop Time 1215    PT Time Calculation (min) 40 min    Activity Tolerance Patient limited by pain;Patient tolerated treatment well    Behavior During Therapy [facility] for tasks assessed/performed           History reviewed. No pertinent past medical history.  History reviewed. No pertinent surgical history.  There were no vitals filed for this visit.    Subjective Assessment - 04/25/20 1435    Subjective Feeling okay, has had some periodic burning.  Daugther present through session.    Patient is accompained by: Family member   Daughter   Currently in Pain? No/denies                     Wound Therapy - 04/25/20 0001    Subjective Feeling okay, has had some periodic burning.  Daugther present through session.    Patient and Family Stated Goals wound to heal.    Date of Onset 11/18/19    Prior Treatments self care, MD and antibiotics    Pain Scale 0-10    Pain Score 0-No pain    Evaluation and Treatment Procedures Explained to Patient/Family Yes    Evaluation and Treatment Procedures agreed to    Wound Properties Date First Assessed: 03/05/20 Time  First Assessed: 1540 Location: Ankle Location Orientation: Right;Medial Wound Description (Comments): circular in nature with yellow eschar; LE in slightly indurated Present on Admission: Yes   Wound Image View All Images View Images    Dressing Type Gauze (Comment);Compression wrap   Santyl, 2x2, profore lite   Dressing Changed Changed    Dressing Status Old drainage    Dressing Change Frequency PRN    Site / Wound Assessment Red;Yellow    % Wound base Red or Granulating 90%   following debridement was 40% at visit 10   % Wound base Yellow/Fibrinous Exudate 10% was 60% at visit 10   Wound Length (cm) 0.6 cm  Was  .8   Wound Width (cm) 1 cm was 1   Wound Depth (cm) 0.2 cm    Wound Volume (cm^3) 0.12 cm^3    Wound Surface Area (cm^2) 0.6 cm^2    Margins Epibole (rolled edges)    Drainage Amount Minimal    Drainage Description Serous    Treatment Cleansed;Debridement (Selective)    Selective Debridement - Location wound bed and epiboled edges    Selective Debridement - Tools Used Forceps;Scalpel    Selective Debridement - Tissue Removed slough    Wound Therapy - Clinical Statement  Pt continues to be limited by pain wiht debridement increasing time wiht debridement.  Slough less adherent and increase ease for removal.  Improved granulation following debridement.  Continued wiht profore lite for edema control.  Pt brought in compression garment with her this session, educated on technqiues to increase ease with donning as well as shown butler for assistance.    Wound Therapy - Functional Problem List difficulty putting a sock on, pain upon ambulation.    Factors Delaying/Impairing Wound Healing Infection - systemic/local;Multiple medical problems;Vascular compromise;Tobacco use    Hydrotherapy Plan Debridement;Dressing change;Patient/family education;Pulsatile lavage with suction    Wound Therapy - Frequency 3X / week    Wound Therapy - Current Recommendations PT    Wound Plan Continue wiht  selective debriement and appropriate dressings.  Profore for edema control.  Review donning techniques/use of butler.  ABI scheduled for 05/01/20.\    Dressing  santyl f/b 2x2 and profore lite with netting                   PT Education - 04/25/20 1442    Education Details Educated donning for ease with compression garment    Person(s) Educated Patient;Child(ren)    Methods Explanation;Demonstration    Comprehension Verbalized understanding;Returned demonstration            PT Short Term Goals - 04/25/20 1443      PT SHORT TERM GOAL #1   Title Pt wound to be 100% granulated to prevent infection.    Status On-going      PT SHORT TERM GOAL #2   Title PT pain to be no greater than a 5/10 in her Rt ankle to allow pt to ambulate without pain    Baseline 4/22:  reports of periodic burning pain, pain wiht debridement    Status Partially Met             PT Long Term Goals - 04/25/20 1444      PT LONG TERM GOAL #1   Title Rt ankle wound to be healed    Status On-going      PT LONG TERM GOAL #2   Title PT to have no pain in her Rt ankle when walking.    Status On-going      PT LONG TERM GOAL #3   Title PT to have obtained and daughter to be able to [facility] and Doff compression garments to prevent future wounds.    Baseline 04/25/20:  Has obtained compression garment, difficulty donning.    Status Partially Met                  Patient will benefit from skilled therapeutic intervention in order to improve the following deficits and impairments:     Visit Diagnosis: Chronic ulcer of right ankle, unspecified ulcer stage (HCC)  Pain in right lower leg  Difficulty in walking, not elsewhere classified     Problem List Patient Active Problem List   Diagnosis Date Noted  . ATHEROSLERO NATV ART EXTREM W/INTERMIT CLAUDICAT 06/17/2009   [name], LPTA/CLT; [place]  Rayetta Humphrey, PT CLT [phone] 04/25/2020, 2:45 PM  [facility] [address] [place], [place], [address] Phone: [phone]   Fax:  [phone]  Name: Tina Tucker MRN: [id number] Date of Birth: 1933-01-04

## 2020-04-28 ENCOUNTER — Ambulatory Visit (HOSPITAL_COMMUNITY): Payer: Medicare HMO | Admitting: Physical Therapy

## 2020-04-28 ENCOUNTER — Encounter (HOSPITAL_COMMUNITY): Payer: Self-pay | Admitting: Physical Therapy

## 2020-04-28 ENCOUNTER — Other Ambulatory Visit: Payer: Self-pay

## 2020-04-28 DIAGNOSIS — L97319 Non-pressure chronic ulcer of right ankle with unspecified severity: Secondary | ICD-10-CM

## 2020-04-28 DIAGNOSIS — M79661 Pain in right lower leg: Secondary | ICD-10-CM | POA: Diagnosis not present

## 2020-04-28 DIAGNOSIS — R262 Difficulty in walking, not elsewhere classified: Secondary | ICD-10-CM

## 2020-04-28 NOTE — Therapy (Addendum)
[facility] [place], [place], [address] Phone: [phone]   Fax:  [phone]  Wound Care Therapy  Patient Details  Name: Tina Tucker MRN: [id number] Date of Birth: 08/03/1932 Referring Provider (PT): xjae Hasanaj  Progress Note Reporting Period 03/28/2020  to 04/28/2020  See note below for Objective Data and Assessment of Progress/Goals.      Encounter Date: 04/28/2020   PT End of Session - 04/28/20 1531    Visit Number 21    Number of Visits 30   Date for PT Re-Evaluation 6/6.2022   Authorization Type [employer] does not need authorization for wound care.  [employer] authorized 12 more visits 4/1 /4/30    Authorization Time Period Requested 12 additional apts on 4/22, visit #20    Authorization - Visit Number 21    Progress Note Due on Visit 30    PT Start Time 6440    PT Stop Time 1515    PT Time Calculation (min) 30 min    Activity Tolerance Patient limited by pain;Patient tolerated treatment well    Behavior During Therapy [facility] for tasks assessed/performed           History reviewed. No pertinent past medical history.  History reviewed. No pertinent surgical history.  There were no vitals filed for this visit.               Wound Therapy - 04/28/20 0001    Subjective Pt states the only time that she has pain is when we are working on tha wound.  States that her daughter was able to get the compression garment on her LT LE but had a very difficult time getting it off asked if there were any tricks.  Pt daughter states that she knows how to get the garment off it just was not as easy as it appeared to be when the therapist was demonstrating it last time.    Patient and Family Stated Goals wound to heal.    Date of Onset 11/18/19    Prior Treatments self care, MD and antibiotics    Pain Scale 0-10    Pain Score 0-No pain    Evaluation and Treatment Procedures Explained to Patient/Family Yes     Evaluation and Treatment Procedures agreed to    Wound Properties Date First Assessed: 03/05/20 Time First Assessed: 1540 Location: Ankle Location Orientation: Right;Medial Wound Description (Comments): circular in nature with yellow eschar; LE in slightly indurated Present on Admission: Yes   Dressing Type Gauze (Comment)    Dressing Changed Changed    Dressing Status Old drainage    Dressing Change Frequency PRN    Site / Wound Assessment Red;Yellow    % Wound base Red or Granulating 95%  Last update was 40% granualted    % Wound base Yellow/Fibrinous Exudate 5%  Was 60% slough    Wound Length (cm) 0.9 cm size was .8   Wound Width (cm) 0.9 cm  Was 1   Wound Depth (cm) 0.2 cm  Was .2   Wound Volume (cm^3) 0.16 cm^3    Wound Surface Area (cm^2) 0.81 cm^2    Undermining (cm) from 7-10    Margins Epibole (rolled edges)    Drainage Amount Scant    Drainage Description Serous    Treatment Cleansed;Debridement (Selective)    Selective Debridement - Location wound bed and epiboled edges    Selective Debridement - Tools Used Forceps;Scalpel    Selective Debridement - Tissue  Removed slough    Wound Therapy - Clinical Statement Pt wound continues to heal slowly.  Therapist emphasized the importance of eating protein,(pt has not ate today), increasing water intake,( pt daughter states pt will not drink water), and decreasing smoking to assist in wound healing.  Pt verbalized understanding. PT will continue to benefit from skilled PT to ensure environment is conductive to wound healing.    Wound Therapy - Functional Problem List difficulty putting a sock on, pain upon ambulation.    Factors Delaying/Impairing Wound Healing Infection - systemic/local;Multiple medical problems;Vascular compromise;Tobacco use    Hydrotherapy Plan Debridement;Dressing change;Patient/family education;Pulsatile lavage with suction    Wound Therapy - Frequency 3X / week  Continue fot 6 weeks or until healed    Wound Therapy  - Current Recommendations PT    Wound Plan Continue wiht selective debriement and appropriate dressings.  Profore for edema control.  Review donning techniques/use of butler.  ABI scheduled for 05/01/20.\    Dressing  santyl f/b hydrogel , 2x2 and profore lite with netting                     PT Short Term Goals - 04/25/20 1443      PT SHORT TERM GOAL #1   Title Pt wound to be 100% granulated to prevent infection.    Status On-going      PT SHORT TERM GOAL #2   Title PT pain to be no greater than a 5/10 in her Rt ankle to allow pt to ambulate without pain    Baseline 4/22:  reports of periodic burning pain, pain wiht debridement    Status Partially Met             PT Long Term Goals - 04/25/20 1444      PT LONG TERM GOAL #1   Title Rt ankle wound to be healed    Status On-going      PT LONG TERM GOAL #2   Title PT to have no pain in her Rt ankle when walking.    Status On-going      PT LONG TERM GOAL #3   Title PT to have obtained and daughter to be able to [facility] and Doff compression garments to prevent future wounds.    Baseline 04/25/20:  Has obtained compression garment, difficulty donning.    Status Partially Met                 Plan - 04/28/20 1532    Clinical Impression Statement see above    Personal Factors and Comorbidities Age;Comorbidity 3+;Fitness;Time since onset of injury/illness/exacerbation    Comorbidities COPD, CAD, THRx 2; varicosities    Examination-Activity Limitations Bathing;Dressing;Locomotion Level    Examination-Participation Restrictions Other    Stability/Clinical Decision Making Evolving/Moderate complexity    Rehab Potential Good    PT Frequency 3x / week    PT Duration 6 weeks    PT Treatment/Interventions Other (comment);Compression bandaging;Patient/family education   debridement,  santyl for chemical debridement.   PT Next Visit Plan PT will need reassessment measure wound and granulation compare to visit 10 and request  for more visits via [place].           Patient will benefit from skilled therapeutic intervention in order to improve the following deficits and impairments:  Pain,Difficulty walking,Decreased skin integrity  Visit Diagnosis: Chronic ulcer of right ankle, unspecified ulcer stage (HCC)  Pain in right lower leg  Difficulty in walking, not elsewhere classified  Problem List Patient Active Problem List   Diagnosis Date Noted  . ATHEROSLERO NATV ART EXTREM W/INTERMIT CLAUDICAT 06/17/2009    Rayetta Humphrey, PT CLT [phone] 04/28/2020, 3:33 PM  [facility] [address] [place], [place], [address] Phone: [phone]   Fax:  [phone]  Name: ALLAYNA ERLICH MRN: [id number] Date of Birth: [birth year]

## 2020-04-30 ENCOUNTER — Ambulatory Visit (HOSPITAL_COMMUNITY): Payer: Medicare HMO | Admitting: Physical Therapy

## 2020-04-30 ENCOUNTER — Other Ambulatory Visit: Payer: Self-pay

## 2020-04-30 DIAGNOSIS — M79661 Pain in right lower leg: Secondary | ICD-10-CM | POA: Diagnosis not present

## 2020-04-30 DIAGNOSIS — L97319 Non-pressure chronic ulcer of right ankle with unspecified severity: Secondary | ICD-10-CM | POA: Diagnosis not present

## 2020-04-30 DIAGNOSIS — R262 Difficulty in walking, not elsewhere classified: Secondary | ICD-10-CM | POA: Diagnosis not present

## 2020-04-30 NOTE — Therapy (Signed)
[facility] [place], [place], [address] Phone: [phone]   Fax:  [phone]  Wound Care Therapy  Patient Details  Name: Tina Tucker MRN: [id number] Date of Birth: 12-23-1932 Referring Provider (PT): xjae Hasanaj   Encounter Date: 04/30/2020   PT End of Session - 04/30/20 1644    Visit Number 22    Number of Visits 24    Date for PT Re-Evaluation 05/03/20    Authorization Type [employer] authorized 12 more visits 4/1 /4/30    Authorization Time Period Requested 12 additional apts on 4/22, visit #20    Authorization - Visit Number 21    Progress Note Due on Visit 30    PT Start Time 1500    PT Stop Time 1530    PT Time Calculation (min) 30 min    Activity Tolerance Patient limited by pain;Patient tolerated treatment well    Behavior During Therapy [facility] for tasks assessed/performed           No past medical history on file.  No past surgical history on file.  There were no vitals filed for this visit.               Wound Therapy - 04/30/20 1640    Subjective Pt reports no issues today.  STates she is getting ABI tomorrow.    Patient and Family Stated Goals wound to heal.    Date of Onset 11/18/19    Prior Treatments self care, MD and antibiotics    Pain Scale 0-10    Pain Score 0-No pain    Evaluation and Treatment Procedures Explained to Patient/Family Yes    Evaluation and Treatment Procedures agreed to    Wound Properties Date First Assessed: 03/05/20 Time First Assessed: 1540 Location: Ankle Location Orientation: Right;Medial Wound Description (Comments): circular in nature with yellow eschar; LE in slightly indurated Present on Admission: Yes   Dressing Type Compression wrap    Dressing Changed Changed    Dressing Status Old drainage    Dressing Change Frequency Monday, Wednesday, Friday    Site / Wound Assessment Red;Yellow;Pink    % Wound base Red or Granulating 95%    % Wound base  Yellow/Fibrinous Exudate 5%    Undermining (cm) from 7-10    Margins Epibole (rolled edges)    Drainage Amount Scant    Drainage Description Serous    Treatment Cleansed;Debridement (Selective)    Selective Debridement - Location wound bed and epiboled edges    Selective Debridement - Tools Used Forceps;Scalpel    Selective Debridement - Tissue Removed slough    Wound Therapy - Clinical Statement Slow healing; completely covered in slough upon dressing removal but able to debride majority away via scapel and forceps.  Pt reports more water intake;getting ABI tomorrow so therapist did not reapply compression.  Contineud with santyl and used medipore tape to secure gauze f/b netting.    Wound Therapy - Functional Problem List difficulty putting a sock on, pain upon ambulation.    Factors Delaying/Impairing Wound Healing Infection - systemic/local;Multiple medical problems;Vascular compromise;Tobacco use    Hydrotherapy Plan Debridement;Dressing change;Patient/family education;Pulsatile lavage with suction    Wound Therapy - Frequency 3X / week    Wound Therapy - Current Recommendations PT    Wound Plan Continue with selective debriement and appropriate dressings.  Profore for edema control if needed.  Review donning techniques/use of butler.  ABI scheduled for 05/01/20.    Dressing  santyl f/b  hydrogel , 2x2, medipore, netting                     PT Short Term Goals - 04/25/20 1443      PT SHORT TERM GOAL #1   Title Pt wound to be 100% granulated to prevent infection.    Status On-going      PT SHORT TERM GOAL #2   Title PT pain to be no greater than a 5/10 in her Rt ankle to allow pt to ambulate without pain    Baseline 4/22:  reports of periodic burning pain, pain wiht debridement    Status Partially Met             PT Long Term Goals - 04/25/20 1444      PT LONG TERM GOAL #1   Title Rt ankle wound to be healed    Status On-going      PT LONG TERM GOAL #2   Title  PT to have no pain in her Rt ankle when walking.    Status On-going      PT LONG TERM GOAL #3   Title PT to have obtained and daughter to be able to [facility] and Doff compression garments to prevent future wounds.    Baseline 04/25/20:  Has obtained compression garment, difficulty donning.    Status Partially Met                  Patient will benefit from skilled therapeutic intervention in order to improve the following deficits and impairments:     Visit Diagnosis: Pain in right lower leg  Chronic ulcer of right ankle, unspecified ulcer stage ([place])  Difficulty in walking, not elsewhere classified     Problem List Patient Active Problem List   Diagnosis Date Noted  . ATHEROSLERO NATV ART EXTREM W/INTERMIT CLAUDICAT 06/17/2009   Teena Irani, PTA/CLT [phone]  Teena Irani 04/30/2020, 4:45 PM  [facility] [address] [place], [place], [address] Phone: [phone]   Fax:  [phone]  Name: Tina Tucker MRN: [id number] Date of Birth: 01-03-1933

## 2020-05-01 ENCOUNTER — Ambulatory Visit (HOSPITAL_COMMUNITY): Payer: Medicare HMO | Admitting: Physical Therapy

## 2020-05-01 DIAGNOSIS — I739 Peripheral vascular disease, unspecified: Secondary | ICD-10-CM | POA: Diagnosis not present

## 2020-05-02 ENCOUNTER — Encounter (HOSPITAL_COMMUNITY): Payer: Self-pay | Admitting: Physical Therapy

## 2020-05-02 ENCOUNTER — Ambulatory Visit (HOSPITAL_COMMUNITY): Payer: Medicare HMO | Admitting: Physical Therapy

## 2020-05-02 ENCOUNTER — Other Ambulatory Visit: Payer: Self-pay

## 2020-05-02 DIAGNOSIS — M79661 Pain in right lower leg: Secondary | ICD-10-CM | POA: Diagnosis not present

## 2020-05-02 DIAGNOSIS — L97319 Non-pressure chronic ulcer of right ankle with unspecified severity: Secondary | ICD-10-CM

## 2020-05-02 DIAGNOSIS — R262 Difficulty in walking, not elsewhere classified: Secondary | ICD-10-CM | POA: Diagnosis not present

## 2020-05-02 NOTE — Therapy (Signed)
[facility] [place], [place], [address] Phone: [phone]   Fax:  [phone]  Wound Care Therapy  Patient Details  Name: Tina Tucker MRN: [id number] Date of Birth: 08-Jun-1932 Referring Provider (PT): Wyn Forster   Encounter Date: 05/02/2020   PT End of Session - 05/02/20 1311    Visit Number 23    Number of Visits 24    Date for PT Re-Evaluation 06/09/20    Authorization Type [employer] authorized 12 more visits 4/1 /4/30(pt does not need authorization for wound care)    Authorization Time Period Requested 12 additional apts on 4/22, visit #20    Authorization - Visit Number 21    Progress Note Due on Visit 30    PT Start Time 1315    PT Stop Time 1350    PT Time Calculation (min) 35 min    Activity Tolerance Patient limited by pain;Patient tolerated treatment well    Behavior During Therapy [facility] for tasks assessed/performed           History reviewed. No pertinent past medical history.  History reviewed. No pertinent surgical history.  There were no vitals filed for this visit.               Wound Therapy - 05/02/20 0001    Subjective Pt had ABI yeserday, does not have the results. Has been tying to drink more water    Patient and Family Stated Goals wound to heal.    Date of Onset 11/18/19    Prior Treatments self care, MD and antibiotics    Pain Scale 0-10    Pain Score 0-No pain   only with debridement   Evaluation and Treatment Procedures Explained to Patient/Family Yes    Evaluation and Treatment Procedures agreed to    Wound Properties Date First Assessed: 03/05/20 Time First Assessed: 1540 Location: Ankle Location Orientation: Right;Medial Wound Description (Comments): circular in nature with yellow eschar; LE in slightly indurated Present on Admission: Yes   Dressing Type Gauze (Comment)    Dressing Changed Changed    Dressing Status Old drainage    Dressing Change Frequency PRN    Site  / Wound Assessment Red;Yellow    % Wound base Red or Granulating --   99   % Wound base Yellow/Fibrinous Exudate --   1   Wound Length (cm) 0.8 cm    Wound Width (cm) 0.9 cm    Wound Depth (cm) 0.2 cm   at deepest, some areas are almost 0   Wound Volume (cm^3) 0.14 cm^3    Wound Surface Area (cm^2) 0.72 cm^2    Undermining (cm) 7-10    Margins Epibole (rolled edges)    Drainage Amount Scant    Drainage Description Serous    Treatment Cleansed;Debridement (Selective)    Selective Debridement - Location wound bed and epiboled edges    Selective Debridement - Tools Used Forceps;Scalpel    Selective Debridement - Tissue Removed slough    Wound Therapy - Clinical Statement Pt wound is well granulized but continues to close slowly.  We will do a trial of decreasing treatment to once a week to see if not distrubing wound will aide in the speed of granulation.  If wound can not be debrided to 98% granualtion or better return to 2x a week, however due to high granualation I do not feel pt needs 3x a weeks at this time    Wound Therapy - Functional Problem  List difficulty putting a sock on, pain upon ambulation.    Factors Delaying/Impairing Wound Healing Infection - systemic/local;Multiple medical problems;Vascular compromise;Tobacco use    Hydrotherapy Plan Debridement;Dressing change;Patient/family education;Pulsatile lavage with suction    Wound Therapy - Frequency Other (comment)    Wound Therapy - Current Recommendations PT    Wound Plan Continue with selective debriement and appropriate dressings.  Profore for edema control if needed.  Review donning techniques/use of butler.  ABI scheduled for 05/01/20.    Dressing  santyl f/b hydrogel , 2x2, medipore, netting                     PT Short Term Goals - 04/25/20 1443      PT SHORT TERM GOAL #1   Title Pt wound to be 100% granulated to prevent infection.    Status On-going      PT SHORT TERM GOAL #2   Title PT pain to be no  greater than a 5/10 in her Rt ankle to allow pt to ambulate without pain    Baseline 4/22:  reports of periodic burning pain, pain wiht debridement    Status Partially Met             PT Long Term Goals - 04/25/20 1444      PT LONG TERM GOAL #1   Title Rt ankle wound to be healed    Status On-going      PT LONG TERM GOAL #2   Title PT to have no pain in her Rt ankle when walking.    Status On-going      PT LONG TERM GOAL #3   Title PT to have obtained and daughter to be able to [facility] and Doff compression garments to prevent future wounds.    Baseline 04/25/20:  Has obtained compression garment, difficulty donning.    Status Partially Met                 Plan - 05/02/20 1448    Personal Factors and Comorbidities Age;Comorbidity 3+;Fitness;Time since onset of injury/illness/exacerbation    Comorbidities COPD, CAD, THRx 2; varicosities    Examination-Activity Limitations Bathing;Dressing;Locomotion Level    Examination-Participation Restrictions Other    Stability/Clinical Decision Making Evolving/Moderate complexity    Rehab Potential Good    PT Frequency 3x / week    PT Duration 6 weeks    PT Treatment/Interventions Other (comment);Compression bandaging;Patient/family education   debridement,  santyl for chemical debridement.   PT Next Visit Plan trial of 1x a week           Patient will benefit from skilled therapeutic intervention in order to improve the following deficits and impairments:  Pain,Difficulty walking,Decreased skin integrity  Visit Diagnosis: Chronic ulcer of right ankle, unspecified ulcer stage ([place])  Difficulty in walking, not elsewhere classified     Problem List Patient Active Problem List   Diagnosis Date Noted  . ATHEROSLERO NATV ART EXTREM W/INTERMIT CLAUDICAT 06/17/2009   Rayetta Humphrey, PT CLT [phone] 05/02/2020, 2:50 PM  [facility] [address] [place], [place],  [address] Phone: [phone]   Fax:  [phone]  Name: Tina Tucker MRN: [id number] Date of Birth: 1932/01/08

## 2020-05-02 NOTE — Addendum Note (Signed)
Addended by: Leeroy Cha on: 05/02/2020 02:44 PM   Modules accepted: Orders

## 2020-05-05 ENCOUNTER — Ambulatory Visit (HOSPITAL_COMMUNITY): Payer: Medicare HMO | Admitting: Physical Therapy

## 2020-05-07 ENCOUNTER — Other Ambulatory Visit: Payer: Self-pay

## 2020-05-07 ENCOUNTER — Ambulatory Visit (HOSPITAL_COMMUNITY): Payer: Medicare HMO | Attending: Internal Medicine | Admitting: Physical Therapy

## 2020-05-07 ENCOUNTER — Encounter (HOSPITAL_COMMUNITY): Payer: Self-pay | Admitting: Physical Therapy

## 2020-05-07 DIAGNOSIS — M79661 Pain in right lower leg: Secondary | ICD-10-CM | POA: Diagnosis not present

## 2020-05-07 DIAGNOSIS — L97319 Non-pressure chronic ulcer of right ankle with unspecified severity: Secondary | ICD-10-CM | POA: Insufficient documentation

## 2020-05-07 DIAGNOSIS — I739 Peripheral vascular disease, unspecified: Secondary | ICD-10-CM

## 2020-05-07 DIAGNOSIS — R262 Difficulty in walking, not elsewhere classified: Secondary | ICD-10-CM | POA: Insufficient documentation

## 2020-05-07 NOTE — Therapy (Signed)
[place], [place], [address] Phone: [phone]   Fax:  [phone]  Wound Care Therapy  Patient Details  Name: Tina Tucker MRN: [id number] Date of Birth: 10-Aug-1932 Referring Provider (PT): Wyn Forster   Encounter Date: 05/07/2020   PT End of Session - 05/07/20 1312    Visit Number 24    Number of Visits 30    Date for PT Re-Evaluation 06/09/20    Authorization Type [employer] authorized 12 more visits 4/1 /4/30(pt does not need authorization for wound care)    Authorization Time Period Requested 12 additional apts on 4/22, visit #20    Authorization - Visit Number 21    Progress Note Due on Visit 30    PT Start Time 1320    PT Stop Time 1345    PT Time Calculation (min) 25 min    Activity Tolerance Patient limited by pain;Patient tolerated treatment well    Behavior During Therapy [facility] for tasks assessed/performed           History reviewed. No pertinent past medical history.  History reviewed. No pertinent surgical history.  There were no vitals filed for this visit.               Wound Therapy - 05/07/20 0001    Subjective Pt states that her MD called her and stated there was a blockage and she should be expecting a call from the [facility] in [place].    Patient and Family Stated Goals wound to heal.    Date of Onset 11/18/19    Prior Treatments self care, MD and antibiotics    Pain Scale 0-10    Pain Score 0-No pain    Evaluation and Treatment Procedures Explained to Patient/Family Yes    Evaluation and Treatment Procedures agreed to    Wound Properties Date First Assessed: 03/05/20 Time First Assessed: 1540 Location: Ankle Location Orientation: Right;Medial Wound Description (Comments): circular in nature with yellow eschar; LE in slightly indurated Present on Admission: Yes   Dressing Type Gauze (Comment)    Dressing Changed Changed    Dressing Status Old drainage     Dressing Change Frequency PRN    Site / Wound Assessment Red;Yellow    % Wound base Red or Granulating 100%   after debridement   Wound Length (cm) 0.7 cm    Wound Width (cm) 0.8 cm    Wound Depth (cm) 0.1 cm    Wound Volume (cm^3) 0.06 cm^3    Wound Surface Area (cm^2) 0.56 cm^2    Undermining (cm) 7-10    Margins Epibole (rolled edges)    Drainage Amount Scant    Drainage Description Serous    Treatment Cleansed;Debridement (Selective)    Selective Debridement - Location wound bed and epiboled edges    Selective Debridement - Tools Used Forceps;Scalpel    Selective Debridement - Tissue Removed slough    Wound Therapy - Clinical Statement Pt being referred to a vascular surgeion following ABI.  Therapist discontinued coban until pt learns results.  Due to 100% granulation therapist changed back to [place] with hydrogel.  Pt continues to have decreased nutritional intake, decreased water intake, decreased mobility and smokes which is causing significant delay in pt healing.    Wound Therapy - Functional Problem List difficulty putting a sock on, pain upon ambulation.    Factors Delaying/Impairing Wound Healing Infection - systemic/local;Multiple medical problems;Vascular compromise;Tobacco use    Hydrotherapy Plan Debridement;Dressing change;Patient/family  education;Pulsatile lavage with suction    Wound Therapy - Frequency Other (comment)    Wound Therapy - Current Recommendations PT    Wound Plan Continue with appropriate wound care.    Dressing  vaseline around wound, medihoney to wound bed 2x2, hydrogel on top of the 2x2 followed by another 2x2, and kerlix with netting to hold dressing in place.                     PT Short Term Goals - 04/25/20 1443      PT SHORT TERM GOAL #1   Title Pt wound to be 100% granulated to prevent infection.    Status On-going      PT SHORT TERM GOAL #2   Title PT pain to be no greater than a 5/10 in her Rt ankle to allow pt to  ambulate without pain    Baseline 4/22:  reports of periodic burning pain, pain wiht debridement    Status Partially Met             PT Long Term Goals - 04/25/20 1444      PT LONG TERM GOAL #1   Title Rt ankle wound to be healed    Status On-going      PT LONG TERM GOAL #2   Title PT to have no pain in her Rt ankle when walking.    Status On-going      PT LONG TERM GOAL #3   Title PT to have obtained and daughter to be able to [facility] and Doff compression garments to prevent future wounds.    Baseline 04/25/20:  Has obtained compression garment, difficulty donning.    Status Partially Met                 Plan - 05/07/20 1314    Clinical Impression Statement see above    Personal Factors and Comorbidities Age;Comorbidity 3+;Fitness;Time since onset of injury/illness/exacerbation    Comorbidities COPD, CAD, THRx 2; varicosities    Examination-Activity Limitations Bathing;Dressing;Locomotion Level    Examination-Participation Restrictions Other    Stability/Clinical Decision Making Evolving/Moderate complexity    Rehab Potential Good    PT Frequency 3x / week    PT Duration 6 weeks    PT Treatment/Interventions Other (comment);Compression bandaging;Patient/family education   debridement,  santyl for chemical debridement.   PT Next Visit Plan trial of 1x a week           Patient will benefit from skilled therapeutic intervention in order to improve the following deficits and impairments:  Pain,Difficulty walking,Decreased skin integrity  Visit Diagnosis: Chronic ulcer of right ankle, unspecified ulcer stage ([place])  Difficulty in walking, not elsewhere classified  Pain in right lower leg     Problem List Patient Active Problem List   Diagnosis Date Noted  . ATHEROSLERO NATV ART EXTREM W/INTERMIT CLAUDICAT 06/17/2009    Rayetta Humphrey, PT CLT [phone] 05/07/2020, 1:52 PM  [place], [place], [address] Phone: [phone]   Fax:  [phone]  Name: Tina Tucker MRN: [id number] Date of Birth: 09/14/1932

## 2020-05-09 ENCOUNTER — Ambulatory Visit (HOSPITAL_COMMUNITY): Payer: Medicare HMO | Admitting: Physical Therapy

## 2020-05-09 ENCOUNTER — Ambulatory Visit (HOSPITAL_COMMUNITY)
Admission: RE | Admit: 2020-05-09 | Discharge: 2020-05-09 | Disposition: A | Discharge status: Home / Self Care | Payer: Medicare HMO | Source: Ambulatory Visit | Attending: Vascular Surgery | Admitting: Vascular Surgery

## 2020-05-09 ENCOUNTER — Other Ambulatory Visit: Payer: Self-pay

## 2020-05-09 DIAGNOSIS — I739 Peripheral vascular disease, unspecified: Secondary | ICD-10-CM | POA: Diagnosis not present

## 2020-05-12 ENCOUNTER — Encounter: Payer: Self-pay | Admitting: Vascular Surgery

## 2020-05-12 ENCOUNTER — Other Ambulatory Visit: Payer: Self-pay

## 2020-05-12 ENCOUNTER — Ambulatory Visit: Payer: Medicare HMO | Admitting: Vascular Surgery

## 2020-05-12 ENCOUNTER — Ambulatory Visit (HOSPITAL_COMMUNITY): Payer: Medicare HMO | Admitting: Physical Therapy

## 2020-05-12 VITALS — BP 157/83 | HR 81 | Temp 97.8°F | Resp 14 | Ht 64.0 in | Wt 131.0 lb

## 2020-05-12 DIAGNOSIS — I739 Peripheral vascular disease, unspecified: Secondary | ICD-10-CM

## 2020-05-12 NOTE — Progress Notes (Signed)
[facility]ist of [place]  Patient name: Tina Tucker MRN: [id number] DOB: 24-Jan-1932 Sex: female  REASON FOR CONSULT: Evaluation lower extremity arterial insufficiency and nonhealing right ankle wound.  HPI: Tina Tucker is a 87 y.o. female, who is here today for evaluation.  She is here with her daughter.  She reports she has had a ulceration on her right medial malleolus for approximately 7 months.  This began in November 2021.  She did have some weeping from chronic venous stasis disease and then developed this ulceration.  She does have moderate discomfort associated with this.  She has been in wound care management for 2 months now and is having a very slow healing.  She underwent noninvasive studies to rule out arterial insufficiency and this did show moderate to severe arterial insufficiency bilaterally.  She is here for further discussion of this.  She does have swelling and pitting edema chronically in both lower extremities.  No history of DVT.  She is a long-term cigarette smoker.  History reviewed. No pertinent past medical history.  History reviewed. No pertinent family history.  SOCIAL HISTORY: Social History   Socioeconomic History  . Marital status: Widowed    Spouse name: Not on file  . Number of children: Not on file  . Years of education: Not on file  . Highest education level: Not on file  Occupational History  . Not on file  Tobacco Use  . Smoking status: Current Every Day Smoker    Types: Cigarettes  . Smokeless tobacco: Never Used  Substance and Sexual Activity  . Alcohol use: Not Currently  . Drug use: Not on file  . Sexual activity: Not on file  Other Topics Concern  . Not on file  Social History Narrative  . Not on file   Social Determinants of Health   Financial Resource Strain: Not on file  Food Insecurity: Not on file  Transportation Needs: Not on file  Physical Activity: Not on file   Stress: Not on file  Social Connections: Not on file  Intimate Partner Violence: Not on file    Allergies  Allergen Reactions  . Lotensin [Benazepril]     Current Outpatient Medications  Medication Sig Dispense Refill  . ALPRAZolam (XANAX) 0.5 MG tablet Take 0.5 mg by mouth 3 (three) times daily.    Marland Kitchen amLODipine (NORVASC) 5 MG tablet Take 5 mg by mouth daily.    Marland Kitchen aspirin EC 81 MG tablet Take 81 mg by mouth daily. Swallow whole.    . COMBIGAN 0.2-0.5 % ophthalmic solution Place 1 drop into both eyes every 12 (twelve) hours.    . COMBIVENT RESPIMAT 20-100 MCG/ACT AERS respimat     . furosemide (LASIX) 20 MG tablet Take 20 mg by mouth daily.    Marland Kitchen losartan (COZAAR) 100 MG tablet Take 100 mg by mouth daily.    . Omega-3 Fatty Acids (FISH OIL) 1000 MG CAPS Take 1 capsule by mouth in the morning, at noon, and at bedtime.    . Travoprost, BAK Free, (TRAVATAN) 0.004 % SOLN ophthalmic solution Place 1 drop into both eyes at bedtime.     No current facility-administered medications for this visit.    REVIEW OF SYSTEMS:  [X]  denotes positive finding, [ ]  denotes negative finding Cardiac  Comments:  Chest pain or chest pressure:    Shortness of breath upon exertion: x   Short of breath when lying flat: x   Irregular heart rhythm:  Vascular    Pain in calf, thigh, or hip brought on by ambulation: x   Pain in feet at night that wakes you up from your sleep:     Blood clot in your veins:    Leg swelling:  x       Pulmonary    Oxygen at home:    Productive cough:  x   Wheezing:         Neurologic    Sudden weakness in arms or legs:  x   Sudden numbness in arms or legs:     Sudden onset of difficulty speaking or slurred speech:    Temporary loss of vision in one eye:     Problems with dizziness:  x       Gastrointestinal    Blood in stool:     Vomited blood:         Genitourinary    Burning when urinating:     Blood in urine:        Psychiatric    Major depression:          Hematologic    Bleeding problems:    Problems with blood clotting too easily:        Skin    Rashes or ulcers:        Constitutional    Fever or chills:      PHYSICAL EXAM: Vitals:   05/12/20 1447  BP: (!) 157/83  Pulse: 81  Resp: 14  Temp: 97.8 F (36.6 C)  TempSrc: Other (Comment)  SpO2: 91%  Weight: 131 lb (59.4 kg)  Height: 5\' 4"  (1.626 m)    GENERAL: The patient is a well-nourished female, in no acute distress. The vital signs are documented above. CARDIOVASCULAR: 2+ radial pulses bilaterally.  2+ femoral pulses bilaterally.  Absent popliteal and distal pulses bilaterally.  Moderate pitting edema bilaterally PULMONARY: There is good air exchange  MUSCULOSKELETAL: There are no major deformities or cyanosis. NEUROLOGIC: No focal weakness or paresthesias are detected. SKIN: 1 cm ulcer on the medial aspect of her right medial malleolus.  Some surrounding erythema.  No evidence of bony involvement. PSYCHIATRIC: The patient has a normal affect.  DATA:  Noninvasive studies from both [facility] in our office were reviewed.  This reveals Acklin index in the 0.5 2.6 range bilaterally with dampened monophasic flow  MEDICAL ISSUES: Had long discussion with the patient and her daughter.  I explained that this clearly is at risk for limb loss with nonhealing ankle wound.  She has a component of both venous insufficiency and arterial insufficiency causing this.  I would recommend continued compression with her dressing.  She is still following with the wound center at [facility] through the physical therapy department.  I would recommend knee-high compression with Ace wrap or Coban to continue with her wound care.    I have recommended arteriogram for further evaluation and planning of improvement of flow to her right foot.  This will be done as an outpatient at [facility] in [place].  She understands that if she does have a lesion amenable to  angioplasty or stenting this will be done in the same setting.  If not we will discuss potential surgical options for revascularization.  We will coordinate this at her earliest [place]. Prescious Hurless, MD [facility]ists of [facility] Tel [phone] Pager [phone]  Note: Portions of this report may have been transcribed using voice  recognition software.  Every effort has been made to ensure accuracy; however, inadvertent computerized transcription errors may still be present.

## 2020-05-12 NOTE — H&P (View-Only) (Signed)
[facility] [place]  Patient name: Tina Tucker MRN: [id number] DOB: 24-Jan-1932 Sex: female  REASON FOR CONSULT: Evaluation lower extremity arterial insufficiency and nonhealing right ankle wound.  HPI: Tina Tucker is a 87 y.o. female, who is here today for evaluation.  She is here with her daughter.  She reports she has had a ulceration on her right medial malleolus for approximately 7 months.  This began in November 2021.  She did have some weeping from chronic venous stasis disease and then developed this ulceration.  She does have moderate discomfort associated with this.  She has been in wound care management for 2 months now and is having a very slow healing.  She underwent noninvasive studies to rule out arterial insufficiency and this did show moderate to severe arterial insufficiency bilaterally.  She is here for further discussion of this.  She does have swelling and pitting edema chronically in both lower extremities.  No history of DVT.  She is a long-term cigarette smoker.  History reviewed. No pertinent past medical history.  History reviewed. No pertinent family history.  SOCIAL HISTORY: Social History   Socioeconomic History  . Marital status: Widowed    Spouse name: Not on file  . Number of children: Not on file  . Years of education: Not on file  . Highest education level: Not on file  Occupational History  . Not on file  Tobacco Use  . Smoking status: Current Every Day Smoker    Types: Cigarettes  . Smokeless tobacco: Never Used  Substance and Sexual Activity  . Alcohol use: Not Currently  . Drug use: Not on file  . Sexual activity: Not on file  Other Topics Concern  . Not on file  Social History Narrative  . Not on file   Social Determinants of Health   Financial Resource Strain: Not on file  Food Insecurity: Not on file  Transportation Needs: Not on file  Physical Activity: Not on file   Stress: Not on file  Social Connections: Not on file  Intimate Partner Violence: Not on file    Allergies  Allergen Reactions  . Lotensin [Benazepril]     Current Outpatient Medications  Medication Sig Dispense Refill  . ALPRAZolam (XANAX) 0.5 MG tablet Take 0.5 mg by mouth 3 (three) times daily.    Marland Kitchen amLODipine (NORVASC) 5 MG tablet Take 5 mg by mouth daily.    Marland Kitchen aspirin EC 81 MG tablet Take 81 mg by mouth daily. Swallow whole.    . COMBIGAN 0.2-0.5 % ophthalmic solution Place 1 drop into both eyes every 12 (twelve) hours.    . COMBIVENT RESPIMAT 20-100 MCG/ACT AERS respimat     . furosemide (LASIX) 20 MG tablet Take 20 mg by mouth daily.    Marland Kitchen losartan (COZAAR) 100 MG tablet Take 100 mg by mouth daily.    . Omega-3 Fatty Acids (FISH OIL) 1000 MG CAPS Take 1 capsule by mouth in the morning, at noon, and at bedtime.    . Travoprost, BAK Free, (TRAVATAN) 0.004 % SOLN ophthalmic solution Place 1 drop into both eyes at bedtime.     No current facility-administered medications for this visit.    REVIEW OF SYSTEMS:  [X]  denotes positive finding, [ ]  denotes negative finding Cardiac  Comments:  Chest pain or chest pressure:    Shortness of breath upon exertion: x   Short of breath when lying flat: x   Irregular heart rhythm:  Vascular    Pain in calf, thigh, or hip brought on by ambulation: x   Pain in feet at night that wakes you up from your sleep:     Blood clot in your veins:    Leg swelling:  x       Pulmonary    Oxygen at home:    Productive cough:  x   Wheezing:         Neurologic    Sudden weakness in arms or legs:  x   Sudden numbness in arms or legs:     Sudden onset of difficulty speaking or slurred speech:    Temporary loss of vision in one eye:     Problems with dizziness:  x       Gastrointestinal    Blood in stool:     Vomited blood:         Genitourinary    Burning when urinating:     Blood in urine:        Psychiatric    Major depression:          Hematologic    Bleeding problems:    Problems with blood clotting too easily:        Skin    Rashes or ulcers:        Constitutional    Fever or chills:      PHYSICAL EXAM: Vitals:   05/12/20 1447  BP: (!) 157/83  Pulse: 81  Resp: 14  Temp: 97.8 F (36.6 C)  TempSrc: Other (Comment)  SpO2: 91%  Weight: 131 lb (59.4 kg)  Height: 5\' 4"  (1.626 m)    GENERAL: The patient is a well-nourished female, in no acute distress. The vital signs are documented above. CARDIOVASCULAR: 2+ radial pulses bilaterally.  2+ femoral pulses bilaterally.  Absent popliteal and distal pulses bilaterally.  Moderate pitting edema bilaterally PULMONARY: There is good air exchange  MUSCULOSKELETAL: There are no major deformities or cyanosis. NEUROLOGIC: No focal weakness or paresthesias are detected. SKIN: 1 cm ulcer on the medial aspect of her right medial malleolus.  Some surrounding erythema.  No evidence of bony involvement. PSYCHIATRIC: The patient has a normal affect.  DATA:  Noninvasive studies from both [facility] in our office were reviewed.  This reveals Acklin index in the 0.5 2.6 range bilaterally with dampened monophasic flow  MEDICAL ISSUES: Had long discussion with the patient and her daughter.  I explained that this clearly is at risk for limb loss with nonhealing ankle wound.  She has a component of both venous insufficiency and arterial insufficiency causing this.  I would recommend continued compression with her dressing.  She is still following with the wound center at [facility] through the physical therapy department.  I would recommend knee-high compression with Ace wrap or Coban to continue with her wound care.    I have recommended arteriogram for further evaluation and planning of improvement of flow to her right foot.  This will be done as an outpatient at [facility] in [place].  She understands that if she does have a lesion amenable to  angioplasty or stenting this will be done in the same setting.  If not we will discuss potential surgical options for revascularization.  We will coordinate this at her earliest [place]. Traeton Bordas, MD [facility] Vascular and Vein Specialists of [facility] Tel [phone] Pager [phone]  Note: Portions of this report may have been transcribed using voice  recognition software.  Every effort has been made to ensure accuracy; however, inadvertent computerized transcription errors may still be present.

## 2020-05-14 ENCOUNTER — Other Ambulatory Visit: Payer: Self-pay

## 2020-05-14 ENCOUNTER — Ambulatory Visit (HOSPITAL_COMMUNITY): Payer: Medicare HMO | Admitting: Physical Therapy

## 2020-05-14 DIAGNOSIS — L97319 Non-pressure chronic ulcer of right ankle with unspecified severity: Secondary | ICD-10-CM | POA: Diagnosis not present

## 2020-05-14 DIAGNOSIS — R262 Difficulty in walking, not elsewhere classified: Secondary | ICD-10-CM

## 2020-05-14 DIAGNOSIS — M79661 Pain in right lower leg: Secondary | ICD-10-CM | POA: Diagnosis not present

## 2020-05-14 NOTE — Therapy (Signed)
[place], [place], [address] Phone: [phone]   Fax:  [phone]  Wound Care Therapy  Patient Details  Name: Tina Tucker MRN: [id number] Date of Birth: 07-15-1932 Referring Provider (PT): xjae Hasanaj   Encounter Date: 05/14/2020   PT End of Session - 05/14/20 1438    Visit Number 25    Number of Visits 30    Date for PT Re-Evaluation 06/09/20    Authorization Type [employer] authorized 12 more visits 4/1 /4/30(pt does not need authorization for wound care)    Authorization Time Period Requested 12 additional apts on 4/22, visit #20    Authorization - Visit Number 25    Progress Note Due on Visit 30    PT Start Time 1400    PT Stop Time 1430    PT Time Calculation (min) 30 min    Activity Tolerance Patient limited by pain;Patient tolerated treatment well    Behavior During Therapy [facility] for tasks assessed/performed           No past medical history on file.  No past surgical history on file.  There were no vitals filed for this visit.               Wound Therapy - 05/14/20 0001    Subjective PT reports she has 50% blockage in both legs and she is going to get stent in on May 24th.    Patient and Family Stated Goals wound to heal.    Date of Onset 11/18/19    Prior Treatments self care, MD and antibiotics    Pain Scale 0-10    Pain Score 0-No pain    Evaluation and Treatment Procedures Explained to Patient/Family Yes    Evaluation and Treatment Procedures agreed to    Wound Properties Date First Assessed: 03/05/20 Time First Assessed: 1540 Location: Ankle Location Orientation: Right;Medial Wound Description (Comments): circular in nature with yellow eschar; LE in slightly indurated Present on Admission: Yes   Dressing Type Compression wrap    Dressing Changed Changed    Dressing Status Old drainage    Dressing Change Frequency PRN    Site / Wound Assessment Red;Yellow    % Wound base  Red or Granulating 100%   following debridement   Wound Length (cm) 0.5 cm    Wound Width (cm) 0.8 cm    Wound Depth (cm) 0.1 cm    Wound Volume (cm^3) 0.04 cm^3    Wound Surface Area (cm^2) 0.4 cm^2    Undermining (cm) 7-10    Drainage Amount Scant    Drainage Description Serous    Treatment Cleansed;Debridement (Selective)    Selective Debridement - Location wound bed    Selective Debridement - Tools Used Forceps    Selective Debridement - Tissue Removed slough    Wound Therapy - Clinical Statement Pt wound continues to heal very slowly, procedure to improve blood flow should significantly assist with this.  PT will continue to benefit from skilled PT once a week to ensure pt is not having any cellulitis or increased swelling which would affect upcoming procecdure    Wound Therapy - Functional Problem List difficulty putting a sock on, pain upon ambulation.    Factors Delaying/Impairing Wound Healing Infection - systemic/local;Multiple medical problems;Vascular compromise;Tobacco use    Hydrotherapy Plan Debridement;Dressing change;Patient/family education;Pulsatile lavage with suction    Wound Therapy - Frequency Other (comment)    Wound Therapy - Current Recommendations PT  Wound Plan Continue with appropriate wound care.    Dressing  vaseline around wound, medihoney to wound bed 2x2, kelix, cotton and coban with netting to hold bandage in place.                     PT Short Term Goals - 04/25/20 1443      PT SHORT TERM GOAL #1   Title Pt wound to be 100% granulated to prevent infection.    Status On-going      PT SHORT TERM GOAL #2   Title PT pain to be no greater than a 5/10 in her Rt ankle to allow pt to ambulate without pain    Baseline 4/22:  reports of periodic burning pain, pain wiht debridement    Status Partially Met             PT Long Term Goals - 04/25/20 1444      PT LONG TERM GOAL #1   Title Rt ankle wound to be healed    Status On-going       PT LONG TERM GOAL #2   Title PT to have no pain in her Rt ankle when walking.    Status On-going      PT LONG TERM GOAL #3   Title PT to have obtained and daughter to be able to [facility] and Doff compression garments to prevent future wounds.    Baseline 04/25/20:  Has obtained compression garment, difficulty donning.    Status Partially Met                 Plan - 05/14/20 1439    Clinical Impression Statement see above    Personal Factors and Comorbidities Age;Comorbidity 3+;Fitness;Time since onset of injury/illness/exacerbation    Comorbidities COPD, CAD, THRx 2; varicosities    Examination-Activity Limitations Bathing;Dressing;Locomotion Level    Examination-Participation Restrictions Other    Stability/Clinical Decision Making Evolving/Moderate complexity    Rehab Potential Good    PT Frequency 3x / week    PT Duration 6 weeks    PT Treatment/Interventions Other (comment);Compression bandaging;Patient/family education   debridement,  santyl for chemical debridement.   PT Next Visit Plan trial of 1x a week           Patient will benefit from skilled therapeutic intervention in order to improve the following deficits and impairments:  Pain,Difficulty walking,Decreased skin integrity  Visit Diagnosis: Chronic ulcer of right ankle, unspecified ulcer stage ([place])  Difficulty in walking, not elsewhere classified  Pain in right lower leg     Problem List Patient Active Problem List   Diagnosis Date Noted  . ATHEROSLERO NATV ART EXTREM W/INTERMIT CLAUDICAT 06/17/2009    Rayetta Humphrey, PT CLT [phone] 05/14/2020, 2:39 PM  [place] [address] [place], [place], [address] Phone: [phone]   Fax:  [phone]  Name: Tina Tucker MRN: [id number] Date of Birth: 30-Jan-1932

## 2020-05-16 ENCOUNTER — Ambulatory Visit (HOSPITAL_COMMUNITY): Payer: Medicare HMO

## 2020-05-20 DIAGNOSIS — J449 Chronic obstructive pulmonary disease, unspecified: Secondary | ICD-10-CM | POA: Diagnosis not present

## 2020-05-20 DIAGNOSIS — I10 Essential (primary) hypertension: Secondary | ICD-10-CM | POA: Diagnosis not present

## 2020-05-20 DIAGNOSIS — Z6824 Body mass index (BMI) 24.0-24.9, adult: Secondary | ICD-10-CM | POA: Diagnosis not present

## 2020-05-20 DIAGNOSIS — I872 Venous insufficiency (chronic) (peripheral): Secondary | ICD-10-CM | POA: Diagnosis not present

## 2020-05-20 DIAGNOSIS — I7389 Other specified peripheral vascular diseases: Secondary | ICD-10-CM | POA: Diagnosis not present

## 2020-05-21 ENCOUNTER — Ambulatory Visit (HOSPITAL_COMMUNITY): Payer: Medicare HMO | Admitting: Physical Therapy

## 2020-05-21 ENCOUNTER — Encounter (HOSPITAL_COMMUNITY): Payer: Self-pay | Admitting: Physical Therapy

## 2020-05-21 ENCOUNTER — Other Ambulatory Visit: Payer: Self-pay

## 2020-05-21 DIAGNOSIS — R262 Difficulty in walking, not elsewhere classified: Secondary | ICD-10-CM | POA: Diagnosis not present

## 2020-05-21 DIAGNOSIS — M79661 Pain in right lower leg: Secondary | ICD-10-CM

## 2020-05-21 DIAGNOSIS — L97319 Non-pressure chronic ulcer of right ankle with unspecified severity: Secondary | ICD-10-CM | POA: Diagnosis not present

## 2020-05-21 NOTE — Therapy (Signed)
[place], [place], [address] Phone: [phone]   Fax:  [phone]  Wound Care Therapy  Patient Details  Name: Tina Tucker MRN: [id number] Date of Birth: 1932-02-04 Referring Provider (PT): Wyn Forster   Encounter Date: 05/21/2020   PT End of Session - 05/21/20 1628    Visit Number 26    Number of Visits 30    Date for PT Re-Evaluation 06/09/20    Authorization Type [employer] authorized 12 more visits 4/1 /4/30(pt does not need authorization for wound care)    Authorization Time Period Requested 12 additional apts on 4/22, visit #20    Authorization - Visit Number 26    Progress Note Due on Visit 30    PT Start Time 1538    PT Stop Time 1618    PT Time Calculation (min) 40 min    Activity Tolerance Patient limited by pain;Patient tolerated treatment well    Behavior During Therapy [facility] for tasks assessed/performed           History reviewed. No pertinent past medical history.  History reviewed. No pertinent surgical history.  There were no vitals filed for this visit.               Wound Therapy - 05/21/20 0001    Subjective PT reports she is nervous for her procedure next week.    Patient and Family Stated Goals wound to heal.    Date of Onset 11/18/19    Prior Treatments self care, MD and antibiotics    Pain Score 0-No pain    Evaluation and Treatment Procedures Explained to Patient/Family Yes    Evaluation and Treatment Procedures agreed to    Wound Properties Date First Assessed: 03/05/20 Time First Assessed: 1540 Location: Ankle Location Orientation: Right;Medial Wound Description (Comments): circular in nature with yellow eschar; LE in slightly indurated Present on Admission: Yes   Dressing Type Compression wrap    Dressing Changed Changed    Dressing Status Old drainage    Dressing Change Frequency PRN    Site / Wound Assessment Red;Yellow    % Wound base Red or Granulating  100%   following debridment   Wound Length (cm) 0.5 cm    Wound Width (cm) 0.7 cm    Wound Depth (cm) 0.1 cm    Wound Volume (cm^3) 0.04 cm^3    Wound Surface Area (cm^2) 0.35 cm^2    Drainage Amount Scant    Drainage Description Serous    Treatment Cleansed;Electrical stimulation;Debridement (Selective)    Selective Debridement - Location wound bed    Selective Debridement - Tools Used Forceps;Scalpel    Selective Debridement - Tissue Removed slough    Wound Therapy - Clinical Statement Patient tolerates debridement fairly well and able to remove all slough from wound bed. Patient wound healing very slowly with decrease in wound size when measured today. Continued with same compression dressing with medihoney to wound bed. Patient will continue to benefit from skilled PT to promote wound healing.    Wound Therapy - Functional Problem List difficulty putting a sock on, pain upon ambulation.    Factors Delaying/Impairing Wound Healing Infection - systemic/local;Multiple medical problems;Vascular compromise;Tobacco use    Hydrotherapy Plan Debridement;Dressing change;Patient/family education;Pulsatile lavage with suction    Wound Therapy - Frequency Other (comment)    Wound Therapy - Current Recommendations PT    Wound Plan Continue with appropriate wound care.    Dressing  vaseline around wound,  medihoney to wound bed 2x2, kelix, cotton and coban with netting to hold bandage in place.                     PT Short Term Goals - 04/25/20 1443      PT SHORT TERM GOAL #1   Title Pt wound to be 100% granulated to prevent infection.    Status On-going      PT SHORT TERM GOAL #2   Title PT pain to be no greater than a 5/10 in her Rt ankle to allow pt to ambulate without pain    Baseline 4/22:  reports of periodic burning pain, pain wiht debridement    Status Partially Met             PT Long Term Goals - 04/25/20 1444      PT LONG TERM GOAL #1   Title Rt ankle wound to be  healed    Status On-going      PT LONG TERM GOAL #2   Title PT to have no pain in her Rt ankle when walking.    Status On-going      PT LONG TERM GOAL #3   Title PT to have obtained and daughter to be able to [facility] and [facility] compression garments to prevent future wounds.    Baseline 04/25/20:  Has obtained compression garment, difficulty donning.    Status Partially Met                 Plan - 05/21/20 1628    Clinical Impression Statement see above    Personal Factors and Comorbidities Age;Comorbidity 3+;Fitness;Time since onset of injury/illness/exacerbation    Comorbidities COPD, CAD, THRx 2; varicosities    Examination-Activity Limitations Bathing;Dressing;Locomotion Level    Examination-Participation Restrictions Other    Stability/Clinical Decision Making Evolving/Moderate complexity    Rehab Potential Good    PT Frequency 3x / week    PT Duration 6 weeks    PT Treatment/Interventions Other (comment);Compression bandaging;Patient/family education   debridement,  santyl for chemical debridement.   PT Next Visit Plan trial of 1x a week           Patient will benefit from skilled therapeutic intervention in order to improve the following deficits and impairments:  Pain,Difficulty walking,Decreased skin integrity  Visit Diagnosis: Chronic ulcer of right ankle, unspecified ulcer stage ([place])  Difficulty in walking, not elsewhere classified  Pain in right lower leg     Problem List Patient Active Problem List   Diagnosis Date Noted  . ATHEROSLERO NATV ART EXTREM W/INTERMIT CLAUDICAT 06/17/2009    4:37 PM, 05/21/20 [name] PT, DPT Physical Therapist at [place], [place], [address] Phone: [phone]   Fax:  [phone]  Name: Tina Tucker MRN: [id number] Date of Birth: 09-23-1932

## 2020-05-26 ENCOUNTER — Other Ambulatory Visit (HOSPITAL_COMMUNITY)
Admission: RE | Admit: 2020-05-26 | Discharge: 2020-05-26 | Disposition: A | Discharge status: Home / Self Care | Payer: Medicare HMO | Source: Ambulatory Visit | Attending: Surgery | Admitting: Surgery

## 2020-05-26 DIAGNOSIS — Z01812 Encounter for preprocedural laboratory examination: Secondary | ICD-10-CM | POA: Diagnosis not present

## 2020-05-26 DIAGNOSIS — Z20822 Contact with and (suspected) exposure to covid-19: Secondary | ICD-10-CM | POA: Diagnosis not present

## 2020-05-26 LAB — SARS CORONAVIRUS 2 (TAT 6-24 HRS): SARS Coronavirus 2: NEGATIVE

## 2020-05-27 ENCOUNTER — Other Ambulatory Visit: Payer: Self-pay

## 2020-05-27 ENCOUNTER — Ambulatory Visit (HOSPITAL_COMMUNITY)
Admission: RE | Admit: 2020-05-27 | Discharge: 2020-05-27 | Disposition: A | Discharge status: Home / Self Care | Payer: Medicare HMO | Source: Ambulatory Visit | Attending: Surgery | Admitting: Surgery

## 2020-05-27 ENCOUNTER — Ambulatory Visit (HOSPITAL_COMMUNITY)
Admission: RE | Disposition: A | Discharge status: Home / Self Care | Payer: Self-pay | Source: Ambulatory Visit | Attending: Surgery

## 2020-05-27 DIAGNOSIS — Z79899 Other long term (current) drug therapy: Secondary | ICD-10-CM | POA: Diagnosis not present

## 2020-05-27 DIAGNOSIS — I70233 Atherosclerosis of native arteries of right leg with ulceration of ankle: Secondary | ICD-10-CM | POA: Diagnosis not present

## 2020-05-27 DIAGNOSIS — L97319 Non-pressure chronic ulcer of right ankle with unspecified severity: Secondary | ICD-10-CM | POA: Insufficient documentation

## 2020-05-27 DIAGNOSIS — Z7982 Long term (current) use of aspirin: Secondary | ICD-10-CM | POA: Insufficient documentation

## 2020-05-27 DIAGNOSIS — Z888 Allergy status to other drugs, medicaments and biological substances status: Secondary | ICD-10-CM | POA: Diagnosis not present

## 2020-05-27 DIAGNOSIS — F17200 Nicotine dependence, unspecified, uncomplicated: Secondary | ICD-10-CM | POA: Insufficient documentation

## 2020-05-27 HISTORY — PX: ABDOMINAL AORTOGRAM W/LOWER EXTREMITY: CATH118223

## 2020-05-27 LAB — POCT I-STAT, CHEM 8
BUN: 18 mg/dL (ref 8–23)
Calcium, Ion: 1.19 mmol/L (ref 1.15–1.40)
Chloride: 100 mmol/L (ref 98–111)
Creatinine, Ser: 0.7 mg/dL (ref 0.44–1.00)
Glucose, Bld: 96 mg/dL (ref 70–99)
HCT: 45 % (ref 36.0–46.0)
Hemoglobin: 15.3 g/dL — ABNORMAL HIGH (ref 12.0–15.0)
Potassium: 4.6 mmol/L (ref 3.5–5.1)
Sodium: 138 mmol/L (ref 135–145)
TCO2: 32 mmol/L (ref 22–32)

## 2020-05-27 SURGERY — ABDOMINAL AORTOGRAM W/LOWER EXTREMITY
Anesthesia: LOCAL | Laterality: Bilateral

## 2020-05-27 MED ORDER — FENTANYL CITRATE (PF) 100 MCG/2ML IJ SOLN
INTRAMUSCULAR | Status: AC
  Filled 2020-05-27: qty 2

## 2020-05-27 MED ORDER — OXYCODONE HCL 5 MG PO TABS: 5.0000 mg | ORAL_TABLET | ORAL | Status: DC | PRN

## 2020-05-27 MED ORDER — FENTANYL CITRATE (PF) 100 MCG/2ML IJ SOLN
INTRAMUSCULAR | Status: DC | PRN
  Administered 2020-05-27: 50 ug via INTRAVENOUS

## 2020-05-27 MED ORDER — LABETALOL HCL 5 MG/ML IV SOLN: 10.0000 mg | INTRAVENOUS | Status: DC | PRN

## 2020-05-27 MED ORDER — LIDOCAINE HCL (PF) 1 % IJ SOLN
INTRAMUSCULAR | Status: AC
  Filled 2020-05-27: qty 30

## 2020-05-27 MED ORDER — ACETAMINOPHEN 325 MG PO TABS: 650.0000 mg | ORAL_TABLET | ORAL | Status: DC | PRN

## 2020-05-27 MED ORDER — HEPARIN (PORCINE) IN NACL 1000-0.9 UT/500ML-% IV SOLN
INTRAVENOUS | Status: AC
  Filled 2020-05-27: qty 500

## 2020-05-27 MED ORDER — IODIXANOL 320 MG/ML IV SOLN
INTRAVENOUS | Status: DC | PRN
  Administered 2020-05-27: 98 mL via INTRA_ARTERIAL

## 2020-05-27 MED ORDER — MIDAZOLAM HCL 2 MG/2ML IJ SOLN
INTRAMUSCULAR | Status: DC | PRN
  Administered 2020-05-27: 1 mg via INTRAVENOUS

## 2020-05-27 MED ORDER — ONDANSETRON HCL 4 MG/2ML IJ SOLN: 4.0000 mg | Freq: Four times a day (QID) | INTRAMUSCULAR | Status: DC | PRN

## 2020-05-27 MED ORDER — HEPARIN (PORCINE) IN NACL 1000-0.9 UT/500ML-% IV SOLN
INTRAVENOUS | Status: DC | PRN
  Administered 2020-05-27 (×2): 500 mL

## 2020-05-27 MED ORDER — SODIUM CHLORIDE 0.9 % IV SOLN: INTRAVENOUS | Status: DC

## 2020-05-27 MED ORDER — LIDOCAINE HCL (PF) 1 % IJ SOLN
INTRAMUSCULAR | Status: DC | PRN
  Administered 2020-05-27: 15 mL via INTRADERMAL

## 2020-05-27 MED ORDER — HYDRALAZINE HCL 20 MG/ML IJ SOLN: 5.0000 mg | INTRAMUSCULAR | Status: DC | PRN

## 2020-05-27 MED ORDER — SODIUM CHLORIDE 0.9% FLUSH: 3.0000 mL | Freq: Two times a day (BID) | INTRAVENOUS | Status: DC

## 2020-05-27 MED ORDER — SODIUM CHLORIDE 0.9 % IV SOLN: 250.0000 mL | INTRAVENOUS | Status: DC | PRN

## 2020-05-27 MED ORDER — MIDAZOLAM HCL 2 MG/2ML IJ SOLN
INTRAMUSCULAR | Status: AC
  Filled 2020-05-27: qty 2

## 2020-05-27 MED ORDER — MORPHINE SULFATE (PF) 2 MG/ML IV SOLN: 2.0000 mg | INTRAVENOUS | Status: DC | PRN

## 2020-05-27 MED ORDER — SODIUM CHLORIDE 0.9% FLUSH: 3.0000 mL | INTRAVENOUS | Status: DC | PRN

## 2020-05-27 MED ORDER — SODIUM CHLORIDE 0.9 % WEIGHT BASED INFUSION: 1.0000 mL/kg/h | INTRAVENOUS | Status: DC

## 2020-05-27 SURGICAL SUPPLY — 9 items
CATH OMNI FLUSH 5F 65CM (CATHETERS) ×2 IMPLANT
KIT MICROPUNCTURE NIT STIFF (SHEATH) ×2 IMPLANT
KIT PV (KITS) ×2 IMPLANT
SHEATH PINNACLE 5F 10CM (SHEATH) ×2 IMPLANT
SHEATH PROBE COVER 6X72 (BAG) ×2 IMPLANT
SYR MEDRAD MARK V 150ML (SYRINGE) ×2 IMPLANT
TRANSDUCER W/STOPCOCK (MISCELLANEOUS) ×2 IMPLANT
TRAY PV CATH (CUSTOM PROCEDURE TRAY) ×2 IMPLANT
WIRE BENTSON .035X145CM (WIRE) ×2 IMPLANT

## 2020-05-27 NOTE — Op Note (Signed)
Patient name: Tina Tucker MRN: [id number] DOB: March 20, 1932 Sex: female  05/27/2020 Pre-operative Diagnosis: Right leg ulcer Post-operative diagnosis:  Same Surgeon:  [name] Procedure Performed:  1.  Ultrasound-guided access, left femoral artery  2.  Abdominal aortogram  3.  Bilateral lower extremity runoff  4.  Second-order catheterization  5.  Conscious sedation,  32 minutes     Indications: This is an 87 year old female with nonhealing right leg wound.  She comes in for further evaluation.  Procedure:  The patient was identified in the holding area and taken to room 8.  The patient was then placed supine on the table and prepped and draped in the usual sterile fashion.  A time out was called.  Conscious sedation was administered with the use of IV fentanyl and Versed under continuous physician and nurse monitoring.  Heart rate, blood pressure, and oxygen saturation were continuously monitored.  Total sedation time was 71minutes.  Ultrasound was used to evaluate the left common femoral artery.  It was patent .  A digital ultrasound image was acquired.  A micropuncture needle was used to access the left common femoral artery under ultrasound guidance.  An 018 wire was advanced without resistance and a micropuncture sheath was placed.  The 018 wire was removed and a benson wire was placed.  The micropuncture sheath was exchanged for a 5 french sheath.  An omniflush catheter was advanced over the wire to the level of L-1.  An abdominal angiogram was obtained.  Next, using the omniflush catheter and a benson wire, the aortic bifurcation was crossed and the catheter was placed into theright external iliac artery and right runoff was obtained.  left runoff was performed via retrograde sheath injections.  Findings:   Aortogram: No significant renal artery stenosis was identified.  The infrarenal abdominal aorta is patent without significant stenosis.  There is luminal narrowing at the  origin of the right common iliac artery however based off of pullback pressures this was not hemodynamically significant.  No significant right external iliac stenosis or left common or external iliac stenosis.  Right Lower Extremity: Right common femoral artery is without hemodynamically significant stenosis.  It is heavily calcified.  The profundofemoral artery is widely patent.  The superficial femoral artery occludes just beyond its origin.  There is reconstitution of the popliteal artery above the knee however this is a very diseased and the popliteal artery is occluded for short segment behind the knee.  There is reconstitution of the below-knee popliteal artery with single-vessel runoff via the peroneal artery  Left Lower Extremity: The left common femoral artery is small in caliber.  The profundofemoral artery is patent throughout its course.  The superficial femoral artery is occluded at its origin.  There is reconstitution of the popliteal artery behind the knee however it is very disease.  There appears to be two-vessel runoff via the anterior tibial and peroneal artery.  Intervention: None  Impression:  #1  Bilateral superficial femoral artery occlusion.  On the right, there is reconstitution of the popliteal artery at the adductor canal however it reoccludes behind the joint space.  There is a diseased below-knee popliteal artery and single-vessel runoff via the peroneal artery.  On the left the common femoral artery is small in caliber and nearly occluded by the sheath.  The superficial femoral artery is occluded at its origin with reconstitution of the diseased popliteal artery and two-vessel runoff  #2  I had difficulty getting a catheter across the  aortic bifurcation because of the calcification.  Because of the extent of the disease, the patient should be considered for a popliteal bypass grafting.  If she turns out to be a prohibitive operative candidate, we could consider an attempt at  percutaneous revascularization which would require stenting across the knee joint.    Theotis Burrow, M.D., [facility] [place] Office: [phone] Pager:  [phone]

## 2020-05-27 NOTE — Progress Notes (Signed)
Site area: left groin fa sheath Site Prior to Removal:  Level 0 Pressure Applied For: 20 minutes Manual:   yes Patient Status During Pull:  stable Post Pull Site:  Level 0 Post Pull Instructions Given:  yes Post Pull Pulses Present: left dp dopplered Dressing Applied:  Gauze and tegaderm Bedrest begins @ 6282  Comments:

## 2020-05-27 NOTE — Discharge Instructions (Signed)
Angiogram, Care After This sheet gives you information about how to care for yourself after your procedure. Your health care provider may also give you more specific instructions. If you have problems or questions, contact your health care provider. What can I expect after the procedure? After the procedure, it is common to have:  Bruising and tenderness at the catheter insertion area.  A collection of blood (hematoma) at the insertion area. This may feel like a small lump under the skin at the insertion site. Follow these instructions at home: Insertion site care  Follow instructions from your health care provider about how to take care of your insertion site. Make sure you: ? Wash your hands with soap and water before and after you change your bandage (dressing). If soap and water are not available, use hand sanitizer. ? Change your dressing as told by your health care provider.  Do not take baths, swim, or use a hot tub until your health care provider approves.  You may shower 24-48 hours after the procedure, or as told by your health care provider. To clean the insertion site: ? Gently wash the area with plain soap and water. ? Pat the area dry with a clean towel. ? Do not rub the site. This may cause bleeding.  Check your insertion site every day for signs of infection. Check for: ? Redness, swelling, or pain. ? Fluid or blood. ? Warmth. ? Pus or a bad smell.  Do not apply powder or lotion to the site. Keep the site clean and dry.   Activity  Do not drive for 24 hours if you were given a sedative during your procedure.  Rest as told by your health care provider, usually for 1-2 days.  Do not lift anything that is heavier than 10 lb (4.5 kg), or the limit that you are told, until your health care provider says that it is safe.  If the insertion site was in your leg, try to avoid stairs for a few days.  Return to your normal activities as told by your health care provider,  usually in about a week. Ask your health care provider what activities are safe for you. General instructions  If your insertion site starts bleeding, lie flat and put pressure on the site. If the bleeding does not stop, get help right away. This is a medical emergency.  Take over-the-counter and prescription medicines only as told by your health care provider.  Drink enough fluid to keep your urine pale yellow. This helps flush the contrast dye from your body.  Keep all follow-up visits as told by your health care provider. This is important.   Contact a health care provider if:  You have a fever or chills.  You have redness, swelling, or pain around your insertion site.  You have fluid or blood coming from your insertion site.  Your insertion site feels warm to the touch.  You have pus or a bad smell coming from your insertion site.  You have more bruising around the insertion site. Get help right away if you have:  A problem with the insertion area, such as: ? The area swells fast or bleeds even after you apply pressure. ? The area becomes pale, cool, tingly, or numb.  Chest pain.  Trouble breathing.  A rash.  Any symptoms of a stroke. "BE FAST" is an easy way to remember the main warning signs of a stroke: ? B - Balance. Signs are dizziness, sudden trouble walking,   or loss of balance. ? E - Eyes. Signs are trouble seeing or a sudden change in vision. ? F - Face. Signs are sudden weakness or loss of feeling of the face, or the face or eyelid drooping on one side. ? A - Arms. Signs are weakness or loss of feeling in an arm. This happens suddenly and usually on one side of the body. ? S - Speech. Signs are sudden trouble speaking, slurred speech, or trouble understanding what people say. ? T - Time. Time to call emergency services. Write down what time symptoms started.  You have other signs of a stroke, such as: ? A sudden, severe headache with no known cause. ? Nausea  or vomiting. ? Seizure. These symptoms may represent a serious problem that is an emergency. Do not wait to see if the symptoms will go away. Get medical help right away. Call your local emergency services (911 in the [place] Do not drive yourself to the hospital. Summary  It is common to have bruising and tenderness at the catheter insertion area.  Do not take baths, swim, or use a hot tub until your health care provider approves. You may shower 24-48 hours after the procedure or as told.  It is important to rest and drink plenty of fluids.  If the insertion site bleeds, lie flat and put pressure on the site. If the bleeding continues, get help right away. This is a medical emergency. This information is not intended to replace advice given to you by your health care provider. Make sure you discuss any questions you have with your health care provider. Document Revised: 10/25/2018 Document Reviewed: 10/25/2018 Elsevier Patient Education  2021 Elsevier Inc.  

## 2020-05-27 NOTE — Interval H&P Note (Signed)
History and Physical Interval Note:  05/27/2020 9:05 AM  Tina Tucker  has presented today for surgery, with the diagnosis of right foot wound.  The various methods of treatment have been discussed with the patient and family. After consideration of risks, benefits and other options for treatment, the patient has consented to  Procedure(s): ABDOMINAL AORTOGRAM W/LOWER EXTREMITY (N/A) as a surgical intervention.  The patient's history has been reviewed, patient examined, no change in status, stable for surgery.  I have reviewed the patient's chart and labs.  Questions were answered to the patient's satisfaction.     [name]

## 2020-05-28 ENCOUNTER — Ambulatory Visit (HOSPITAL_COMMUNITY): Payer: Medicare HMO | Admitting: Physical Therapy

## 2020-05-28 ENCOUNTER — Encounter (HOSPITAL_COMMUNITY): Payer: Self-pay | Admitting: Surgery

## 2020-05-28 DIAGNOSIS — L97319 Non-pressure chronic ulcer of right ankle with unspecified severity: Secondary | ICD-10-CM

## 2020-05-28 DIAGNOSIS — R262 Difficulty in walking, not elsewhere classified: Secondary | ICD-10-CM | POA: Diagnosis not present

## 2020-05-28 DIAGNOSIS — M79661 Pain in right lower leg: Secondary | ICD-10-CM

## 2020-05-28 MED FILL — Lidocaine HCl Local Preservative Free (PF) Inj 1%: INTRAMUSCULAR | Qty: 30 | Status: AC

## 2020-05-28 NOTE — Therapy (Signed)
[place], [place], [address] Phone: [phone]   Fax:  [phone]  Wound Care Therapy  Patient Details  Name: Tina Tucker MRN: [id number] Date of Birth: [birth year] Referring Provider (PT): Wyn Forster   Encounter Date: 05/28/2020   PT End of Session - 05/28/20 1533    Visit Number 27    Number of Visits 30    Date for PT Re-Evaluation 06/09/20    Authorization Type [employer] authorized 12 more visits 4/1 /4/30(pt does not need authorization for wound care)    Authorization Time Period Requested 12 additional apts on 4/22, visit #20    Authorization - Visit Number 27    Progress Note Due on Visit 30    Activity Tolerance Patient limited by pain;Patient tolerated treatment well    Behavior During Therapy [facility] for tasks assessed/performed           No past medical history on file.  Past Surgical History:  Procedure Laterality Date  . ABDOMINAL AORTOGRAM W/LOWER EXTREMITY Bilateral 05/27/2020   Procedure: ABDOMINAL AORTOGRAM W/LOWER EXTREMITY;  Surgeon: Serafina Mitchell, MD;  Location: [facility];  Service: Cardiovascular;  Laterality: Bilateral;    There were no vitals filed for this visit.               Wound Therapy - 05/28/20 0001    Subjective PT and daughter state that they went in to place the stent into her leg yesterday and she was 100% blocked.  They stated that it would be to risky to put the stent in when you are 100% blocked therefore the surgery was cancelled.  The MD's are going to consult with each other and decided what the best way to go will be.    Pain Scale 0-10    Pain Score 0-No pain   unless foot is up or we are debriding.   Patients Stated Pain Goal 0    Wound Properties Date First Assessed: 03/05/20 Time First Assessed: 1540 Location: Ankle Location Orientation: Right;Medial Wound Description (Comments): circular in nature with yellow eschar; LE in slightly  indurated Present on Admission: Yes   Dressing Type Compression wrap    Dressing Changed Changed    Dressing Status Old drainage    Dressing Change Frequency PRN    Site / Wound Assessment Pale;Red    % Wound base Red or Granulating 90%    % Wound base Yellow/Fibrinous Exudate 10%    Peri-wound Assessment Erythema (blanchable)   red halo around wound of .5 cm   Wound Length (cm) 0.4 cm   was .5   Wound Width (cm) 0.7 cm   was .7   Wound Depth (cm) 0.1 cm   was .1   Wound Volume (cm^3) 0.03 cm^3    Wound Surface Area (cm^2) 0.28 cm^2    Margins Epibole (rolled edges)    Drainage Amount Scant    Drainage Description Serous    Treatment Debridement (Selective)    Selective Debridement - Location wound bed attempted epiboled edges but this is to painful for pt.    Selective Debridement - Tools Used Forceps    Selective Debridement - Tissue Removed slough    Wound Therapy - Clinical Statement PT unable to have stent put in due to 100% blockage.  Santyl continues to be cleaning wound bed.  Will continue to see pt 1x a week to make sure wound is healing properly but this will be  slow without increased blood flow.  No compression on wound .    Wound Therapy - Functional Problem List difficulty putting a sock on, pain upon ambulation.    Factors Delaying/Impairing Wound Healing Infection - systemic/local;Multiple medical problems;Vascular compromise;Tobacco use    Hydrotherapy Plan Debridement;Dressing change;Patient/family education;Pulsatile lavage with suction    Wound Therapy - Frequency Other (comment)    Wound Therapy - Current Recommendations PT    Wound Plan Continue with debridement as tolerated on a weekly basis    Dressing  vaseline to edges, santyl to wound bed 2x2 f/b hydrogel second 2x2, 3" kling and netting.                     PT Short Term Goals - 04/25/20 1443      PT SHORT TERM GOAL #1   Title Pt wound to be 100% granulated to prevent infection.    Status  On-going      PT SHORT TERM GOAL #2   Title PT pain to be no greater than a 5/10 in her Rt ankle to allow pt to ambulate without pain    Baseline 4/22:  reports of periodic burning pain, pain wiht debridement    Status Partially Met             PT Long Term Goals - 04/25/20 1444      PT LONG TERM GOAL #1   Title Rt ankle wound to be healed    Status On-going      PT LONG TERM GOAL #2   Title PT to have no pain in her Rt ankle when walking.    Status On-going      PT LONG TERM GOAL #3   Title PT to have obtained and daughter to be able to [facility] and [facility] compression garments to prevent future wounds.    Baseline 04/25/20:  Has obtained compression garment, difficulty donning.    Status Partially Met                 Plan - 05/28/20 1534    Clinical Impression Statement as above    Personal Factors and Comorbidities Age;Comorbidity 3+;Fitness;Time since onset of injury/illness/exacerbation    Comorbidities COPD, CAD, THRx 2; varicosities    Examination-Activity Limitations Bathing;Dressing;Locomotion Level    Examination-Participation Restrictions Other    Stability/Clinical Decision Making Evolving/Moderate complexity    Rehab Potential Good    PT Frequency 3x / week    PT Duration 6 weeks    PT Treatment/Interventions Other (comment);Compression bandaging;Patient/family education   debridement,  santyl for chemical debridement.   PT Next Visit Plan trial of 1x a week           Patient will benefit from skilled therapeutic intervention in order to improve the following deficits and impairments:  Pain,Difficulty walking,Decreased skin integrity  Visit Diagnosis: Chronic ulcer of right ankle, unspecified ulcer stage ([place])  Difficulty in walking, not elsewhere classified  Pain in right lower leg     Problem List Patient Active Problem List   Diagnosis Date Noted  . ATHEROSLERO NATV ART EXTREM W/INTERMIT CLAUDICAT 06/17/2009    Rayetta Humphrey, PT  CLT [phone] 05/28/2020, 3:34 PM  [facility] [address] [place], [place], [address] Phone: [phone]   Fax:  [phone]  Name: Tina Tucker MRN: [id number] Date of Birth: 01-22-1932

## 2020-06-03 ENCOUNTER — Telehealth: Payer: Self-pay

## 2020-06-03 NOTE — Telephone Encounter (Addendum)
6/3 - Received call from daughter that patient would like to schedule a visit with Dr. Donnetta Hutching. Investigated a little, patient still does not want to have cardiac clearance or surgery - but wants to discuss other options. Scheduled a phone visit and passed along info to MD.  Received telephone communication from Dr. Donnetta Hutching that patient needs cards clearance and office visit prior to being scheduled for a right femoral popliteal bypass. Patient has decided they do not want surgery - "it is too risky and I am too old." Refuses office visit. Informed MD.

## 2020-06-04 ENCOUNTER — Ambulatory Visit: Payer: Medicare HMO | Admitting: Cardiology

## 2020-06-04 ENCOUNTER — Ambulatory Visit (HOSPITAL_COMMUNITY): Payer: Medicare HMO | Attending: Internal Medicine | Admitting: Physical Therapy

## 2020-06-04 ENCOUNTER — Other Ambulatory Visit: Payer: Self-pay

## 2020-06-04 DIAGNOSIS — M79661 Pain in right lower leg: Secondary | ICD-10-CM | POA: Diagnosis not present

## 2020-06-04 DIAGNOSIS — R262 Difficulty in walking, not elsewhere classified: Secondary | ICD-10-CM | POA: Diagnosis not present

## 2020-06-04 DIAGNOSIS — L97319 Non-pressure chronic ulcer of right ankle with unspecified severity: Secondary | ICD-10-CM | POA: Diagnosis not present

## 2020-06-04 NOTE — Therapy (Signed)
[place], [place], [address] Phone: [phone]   Fax:  [phone]  Wound Care Therapy  Patient Details  Name: Tina Tucker MRN: [id number] Date of Birth: 23-Oct-1932 Referring Provider (PT): Wyn Forster   Encounter Date: 06/04/2020   PT End of Session - 06/04/20 1547    Visit Number 28    Number of Visits 30    Date for PT Re-Evaluation 06/09/20    Authorization Type [employer] authorized 12 more visits 4/1 /4/30(pt does not need authorization for wound care)    Authorization Time Period Requested 12 additional apts on 4/22, visit #20    Authorization - Visit Number 28    Progress Note Due on Visit 30    PT Start Time 1448    PT Stop Time 1512    PT Time Calculation (min) 24 min    Activity Tolerance Patient limited by pain;Patient tolerated treatment well    Behavior During Therapy [facility] for tasks assessed/performed           No past medical history on file.  Past Surgical History:  Procedure Laterality Date  . ABDOMINAL AORTOGRAM W/LOWER EXTREMITY Bilateral 05/27/2020   Procedure: ABDOMINAL AORTOGRAM W/LOWER EXTREMITY;  Surgeon: Serafina Mitchell, MD;  Location: [facility];  Service: Cardiovascular;  Laterality: Bilateral;    There were no vitals filed for this visit.               Wound Therapy - 06/04/20 0001    Subjective PT and daughter state that they went in to place the stent into her leg yesterday and she was 100% blocked.  They stated that it would be to risky to put the stent in when you are 100% blocked therefore the surgery was cancelled.  The MD's are going to consult with each other and decided what the best way to go will be.    Pain Scale 0-10    Pain Score 0-No pain    Wound Properties Date First Assessed: 03/05/20 Time First Assessed: 1540 Location: Ankle Location Orientation: Right;Medial Wound Description (Comments): circular in nature with yellow eschar; LE in  slightly indurated Present on Admission: Yes   Dressing Type Gauze (Comment)   santyl   Dressing Changed Changed    Dressing Status Old drainage    Dressing Change Frequency PRN    Site / Wound Assessment Pale;Red    % Wound base Red or Granulating 90%    % Wound base Yellow/Fibrinous Exudate 10%    Wound Length (cm) 0.4 cm    Wound Width (cm) 0.7 cm    Wound Depth (cm) 0.1 cm    Wound Volume (cm^3) 0.03 cm^3    Wound Surface Area (cm^2) 0.28 cm^2    Margins Epibole (rolled edges)    Drainage Amount Scant    Drainage Description Serous    Treatment Debridement (Selective);Cleansed    Selective Debridement - Location wound bed and edges    Selective Debridement - Tools Used Forceps    Selective Debridement - Tissue Removed slough    Wound Therapy - Clinical Statement daughter reports the next step would be a arterial bypass; states her mother refused but is now considering.  States they have an appt with Dr Donnetta Hutching to discuss and she would let her mom decide.  Wound dressing was pushed off of woundbed today and was covered 100% but able to debride fully without diffiuclty.   Wound without change in size  from last week.  Continue with santyl.    Wound Therapy - Functional Problem List difficulty putting a sock on, pain upon ambulation.    Factors Delaying/Impairing Wound Healing Infection - systemic/local;Multiple medical problems;Vascular compromise;Tobacco use    Hydrotherapy Plan Debridement;Dressing change;Patient/family education;Pulsatile lavage with suction    Wound Therapy - Frequency Other (comment)    Wound Therapy - Current Recommendations PT    Wound Plan Continue with debridement as tolerated on a weekly basis.  Measure and photograph each week (pt is once week)    Dressing  vaseline to edges, santyl to wound bed 2x2 f/b hydrogel second 2x2, 3" kling and netting.                     PT Short Term Goals - 04/25/20 1443      PT SHORT TERM GOAL #1   Title Pt wound  to be 100% granulated to prevent infection.    Status On-going      PT SHORT TERM GOAL #2   Title PT pain to be no greater than a 5/10 in her Rt ankle to allow pt to ambulate without pain    Baseline 4/22:  reports of periodic burning pain, pain wiht debridement    Status Partially Met             PT Long Term Goals - 04/25/20 1444      PT LONG TERM GOAL #1   Title Rt ankle wound to be healed    Status On-going      PT LONG TERM GOAL #2   Title PT to have no pain in her Rt ankle when walking.    Status On-going      PT LONG TERM GOAL #3   Title PT to have obtained and daughter to be able to [facility] and [employer] compression garments to prevent future wounds.    Baseline 04/25/20:  Has obtained compression garment, difficulty donning.    Status Partially Met                  Patient will benefit from skilled therapeutic intervention in order to improve the following deficits and impairments:     Visit Diagnosis: Chronic ulcer of right ankle, unspecified ulcer stage (HCC)  Difficulty in walking, not elsewhere classified  Pain in right lower leg     Problem List Patient Active Problem List   Diagnosis Date Noted  . ATHEROSLERO NATV ART EXTREM W/INTERMIT CLAUDICAT 06/17/2009   Teena Irani, PTA/CLT [phone]  Teena Irani 06/04/2020, 3:49 PM  [place] [address] [place], [place], [address] Phone: [phone]   Fax:  [phone]  Name: CALLIE FACEY MRN: [id number] Date of Birth: Jun 03, 1932

## 2020-06-09 ENCOUNTER — Telehealth: Payer: Self-pay | Admitting: Vascular Surgery

## 2020-06-09 NOTE — Telephone Encounter (Signed)
Spoke with patient by telephone.  She reports that her ankle wound has continued to improve at the wound center.  Wishes to defer bypass.  I feel that this is appropriate since she is not having any progression currently.  Explained the concern regarding extensive peripheral vascular disease.  Could undergo femoral to popliteal for revascularization if needed.  We will continue to follow clinically and not schedule surgery.  The patient will notify us should she have any progression of her wound.

## 2020-06-11 ENCOUNTER — Ambulatory Visit (HOSPITAL_COMMUNITY): Payer: Medicare HMO | Admitting: Physical Therapy

## 2020-06-11 ENCOUNTER — Other Ambulatory Visit: Payer: Self-pay

## 2020-06-11 DIAGNOSIS — L97319 Non-pressure chronic ulcer of right ankle with unspecified severity: Secondary | ICD-10-CM | POA: Diagnosis not present

## 2020-06-11 DIAGNOSIS — R262 Difficulty in walking, not elsewhere classified: Secondary | ICD-10-CM

## 2020-06-11 DIAGNOSIS — M79661 Pain in right lower leg: Secondary | ICD-10-CM

## 2020-06-11 NOTE — Therapy (Signed)
[place], [place], [address] Phone: [phone]   Fax:  [phone]  Wound Care Therapy    Patient Details  Name: Tina Tucker MRN: [id number] Date of Birth: [birth year] Referring Provider (PT): xjae Hasanaj  PHYSICAL THERAPY DISCHARGE SUMMARY  Visits from Start of Care: 29  Current functional level related to goals / functional outcomes: See below    Remaining deficits: PT continues to have a wound.  Severe arterial blockage but  Pt is not a surgical candidate.    Education / Equipment: Self care for wound.  Plan: Patient agrees to discharge.  Patient goals were not met. Patient is being discharged due to lack of progress.  ?????       Encounter Date: 06/11/2020   PT End of Session - 06/11/20 1523    Visit Number 29    Number of Visits 29    Date for PT Re-Evaluation 06/09/20    Authorization Type [employer] authorized 12 more visits 4/1 /4/30(pt does not need authorization for wound care)    Authorization Time Period Requested 12 additional apts on 4/22, visit #20    Authorization - Visit Number 29    Progress Note Due on Visit 29    PT Start Time 1445    PT Stop Time 1510    PT Time Calculation (min) 25 min    Activity Tolerance Patient limited by pain;Patient tolerated treatment well    Behavior During Therapy [facility] for tasks assessed/performed           No past medical history on file.  Past Surgical History:  Procedure Laterality Date  . ABDOMINAL AORTOGRAM W/LOWER EXTREMITY Bilateral 05/27/2020   Procedure: ABDOMINAL AORTOGRAM W/LOWER EXTREMITY;  Surgeon: Serafina Mitchell, MD;  Location: [facility];  Service: Cardiovascular;  Laterality: Bilateral;    There were no vitals filed for this visit.               Wound Therapy - 06/11/20 0001    Subjective PT states that the cardiovascular surgeon called back and stated to continue to care for her wound; he will only do  the surgery if the wound is getting worse.    Pain Scale 0-10    Pain Score 0-No pain    Wound Properties Date First Assessed: 03/05/20 Time First Assessed: 1540 Location: Ankle Location Orientation: Right;Medial Wound Description (Comments): circular in nature with yellow eschar; LE in slightly indurated Present on Admission: Yes   Dressing Type Gauze (Comment)    Dressing Changed Changed    Dressing Status Old drainage    Dressing Change Frequency PRN    Site / Wound Assessment Red    % Wound base Red or Granulating 95%    % Wound base Yellow/Fibrinous Exudate 5%    Wound Length (cm) 0.3 cm    Wound Width (cm) 0.7 cm    Wound Depth (cm) 0.2 cm    Wound Volume (cm^3) 0.04 cm^3    Wound Surface Area (cm^2) 0.21 cm^2    Margins Epibole (rolled edges)    Drainage Amount Scant    Drainage Description Serous    Treatment Debridement (Selective)    Selective Debridement - Location wound bed    Selective Debridement - Tools Used Forceps    Selective Debridement - Tissue Removed slough    Wound Therapy - Clinical Statement Pt, daughter and PT discussed the fact that the wound may take several months to heal if  ever.  Daughter has been observing therapist treat wound and feels that she will be able to take care of the wound.  Therapist instructed daughter to cleanse the wound daily followed by santyl and hydrogel.  Once santyl is gone to use medihoney and then triple antibiotic.    Wound Therapy - Functional Problem List difficulty putting a sock on, pain upon ambulation.    Factors Delaying/Impairing Wound Healing Infection - systemic/local;Multiple medical problems;Vascular compromise;Tobacco use    Hydrotherapy Plan Debridement;Dressing change;Patient/family education;Pulsatile lavage with suction    Wound Therapy - Frequency Other (comment)    Wound Therapy - Current Recommendations PT    Wound Plan Discharge to self care.    Dressing  santyl, 2x2, hydrogel , 2x2 and then medipore tape.                      PT Short Term Goals - 06/11/20 1525      PT SHORT TERM GOAL #1   Title Pt wound to be 100% granulated to prevent infection.    Status Partially Met      PT SHORT TERM GOAL #2   Title PT pain to be no greater than a 5/10 in her Rt ankle to allow pt to ambulate without pain    Baseline 4/22:  reports of periodic burning pain, pain wiht debridement    Status Achieved             PT Long Term Goals - 06/11/20 1525      PT LONG TERM GOAL #1   Title Rt ankle wound to be healed    Status On-going      PT LONG TERM GOAL #2   Title PT to have no pain in her Rt ankle when walking.    Status On-going      PT LONG TERM GOAL #3   Title PT to have obtained and daughter to be able to [facility] and [employer] compression garments to prevent future wounds.    Baseline 04/25/20:  Has obtained compression garment, difficulty donning.    Status Revised   no compression due to arterial blockage                Plan - 06/11/20 1524    Clinical Impression Statement as above    Personal Factors and Comorbidities Age;Comorbidity 3+;Fitness;Time since onset of injury/illness/exacerbation    Comorbidities COPD, CAD, THRx 2; varicosities    Examination-Activity Limitations Bathing;Dressing;Locomotion Level    Examination-Participation Restrictions Other    Stability/Clinical Decision Making Evolving/Moderate complexity    Clinical Decision Making Moderate    Rehab Potential Good    PT Frequency 3x / week    PT Duration 6 weeks    PT Treatment/Interventions Other (comment);Compression bandaging;Patient/family education   debridement,  santyl for chemical debridement.   PT Next Visit Plan discharge to self care.    Consulted and Agree with Plan of Care Patient           Patient will benefit from skilled therapeutic intervention in order to improve the following deficits and impairments:  Pain,Difficulty walking,Decreased skin integrity  Visit Diagnosis: Chronic ulcer  of right ankle, unspecified ulcer stage ([place])  Difficulty in walking, not elsewhere classified  Pain in right lower leg     Problem List Patient Active Problem List   Diagnosis Date Noted  . ATHEROSLERO NATV ART EXTREM W/INTERMIT CLAUDICAT 06/17/2009    Rayetta Humphrey, PT CLT [phone] 06/11/2020, 3:26 PM  [place]  [facility] [place], [place], [address] Phone: [phone]   Fax:  [phone]  Name: Tina Tucker MRN: [id number] Date of Birth: 04-15-1932

## 2020-06-30 ENCOUNTER — Ambulatory Visit (INDEPENDENT_AMBULATORY_CARE_PROVIDER_SITE_OTHER): Payer: Medicare HMO | Admitting: Vascular Surgery

## 2020-06-30 ENCOUNTER — Encounter: Payer: Self-pay | Admitting: Vascular Surgery

## 2020-06-30 ENCOUNTER — Other Ambulatory Visit: Payer: Self-pay

## 2020-06-30 VITALS — Ht 62.0 in | Wt 136.0 lb

## 2020-06-30 DIAGNOSIS — I739 Peripheral vascular disease, unspecified: Secondary | ICD-10-CM | POA: Diagnosis not present

## 2020-06-30 NOTE — Progress Notes (Addendum)
I spoke with the patient's daughter by telephone, Melanie Crazier, who is primary caregiver.  Rayne Du, LPN pretty registered the patient and discussed the telephone encounter.  Assured this was the appropriate patient.  I did not speak with Ms. Geralyn Corwin.  Offered it to her daughter.  She prefers to communicate by discussion.  Ms. Mel Almond reports that the patient is not having any significant pain associated with her right ankle ulcer.  She has been discharged from the wound center.  The daughter is continuing to do local wound care and reports that it is no worse and may be slightly better.  I again discussed that the indication to be more aggressive would be rest pain or progressive tissue loss.  Obviously would like to avoid proceeding to femoral to popliteal bypass if possible.  The patient and daughter will continue to keep a close eye on the wound with wound care locally and will notify should she develop any progression or pain.  Time with a telephone visit was 5 to 10 minutes  Patient: At home Provider: At office

## 2020-08-25 DIAGNOSIS — I7389 Other specified peripheral vascular diseases: Secondary | ICD-10-CM | POA: Diagnosis not present

## 2020-08-25 DIAGNOSIS — I872 Venous insufficiency (chronic) (peripheral): Secondary | ICD-10-CM | POA: Diagnosis not present

## 2020-08-25 DIAGNOSIS — I10 Essential (primary) hypertension: Secondary | ICD-10-CM | POA: Diagnosis not present

## 2020-08-25 DIAGNOSIS — J449 Chronic obstructive pulmonary disease, unspecified: Secondary | ICD-10-CM | POA: Diagnosis not present

## 2020-08-25 DIAGNOSIS — Z6824 Body mass index (BMI) 24.0-24.9, adult: Secondary | ICD-10-CM | POA: Diagnosis not present

## 2020-10-21 DIAGNOSIS — H401131 Primary open-angle glaucoma, bilateral, mild stage: Secondary | ICD-10-CM | POA: Diagnosis not present

## 2020-11-25 DIAGNOSIS — I10 Essential (primary) hypertension: Secondary | ICD-10-CM | POA: Diagnosis not present

## 2020-11-25 DIAGNOSIS — Z6824 Body mass index (BMI) 24.0-24.9, adult: Secondary | ICD-10-CM | POA: Diagnosis not present

## 2020-11-25 DIAGNOSIS — I7389 Other specified peripheral vascular diseases: Secondary | ICD-10-CM | POA: Diagnosis not present

## 2020-11-25 DIAGNOSIS — J449 Chronic obstructive pulmonary disease, unspecified: Secondary | ICD-10-CM | POA: Diagnosis not present

## 2020-11-25 DIAGNOSIS — I872 Venous insufficiency (chronic) (peripheral): Secondary | ICD-10-CM | POA: Diagnosis not present

## 2020-11-25 DIAGNOSIS — E7849 Other hyperlipidemia: Secondary | ICD-10-CM | POA: Diagnosis not present

## 2020-11-25 DIAGNOSIS — Z0000 Encounter for general adult medical examination without abnormal findings: Secondary | ICD-10-CM | POA: Diagnosis not present

## 2020-12-02 DIAGNOSIS — H401131 Primary open-angle glaucoma, bilateral, mild stage: Secondary | ICD-10-CM | POA: Diagnosis not present

## 2021-02-24 DIAGNOSIS — I7389 Other specified peripheral vascular diseases: Secondary | ICD-10-CM | POA: Diagnosis not present

## 2021-02-24 DIAGNOSIS — J449 Chronic obstructive pulmonary disease, unspecified: Secondary | ICD-10-CM | POA: Diagnosis not present

## 2021-02-24 DIAGNOSIS — F419 Anxiety disorder, unspecified: Secondary | ICD-10-CM | POA: Diagnosis not present

## 2021-02-24 DIAGNOSIS — I10 Essential (primary) hypertension: Secondary | ICD-10-CM | POA: Diagnosis not present

## 2021-02-24 DIAGNOSIS — Z6824 Body mass index (BMI) 24.0-24.9, adult: Secondary | ICD-10-CM | POA: Diagnosis not present

## 2021-02-24 DIAGNOSIS — Z0000 Encounter for general adult medical examination without abnormal findings: Secondary | ICD-10-CM | POA: Diagnosis not present

## 2021-02-24 DIAGNOSIS — I872 Venous insufficiency (chronic) (peripheral): Secondary | ICD-10-CM | POA: Diagnosis not present

## 2021-03-03 DIAGNOSIS — H401111 Primary open-angle glaucoma, right eye, mild stage: Secondary | ICD-10-CM | POA: Diagnosis not present

## 2021-05-27 DIAGNOSIS — I7389 Other specified peripheral vascular diseases: Secondary | ICD-10-CM | POA: Diagnosis not present

## 2021-05-27 DIAGNOSIS — I10 Essential (primary) hypertension: Secondary | ICD-10-CM | POA: Diagnosis not present

## 2021-05-27 DIAGNOSIS — Z0000 Encounter for general adult medical examination without abnormal findings: Secondary | ICD-10-CM | POA: Diagnosis not present

## 2021-05-27 DIAGNOSIS — F419 Anxiety disorder, unspecified: Secondary | ICD-10-CM | POA: Diagnosis not present

## 2021-05-27 DIAGNOSIS — I872 Venous insufficiency (chronic) (peripheral): Secondary | ICD-10-CM | POA: Diagnosis not present

## 2021-05-27 DIAGNOSIS — J449 Chronic obstructive pulmonary disease, unspecified: Secondary | ICD-10-CM | POA: Diagnosis not present

## 2021-05-27 DIAGNOSIS — Z6823 Body mass index (BMI) 23.0-23.9, adult: Secondary | ICD-10-CM | POA: Diagnosis not present

## 2021-07-09 DIAGNOSIS — H401111 Primary open-angle glaucoma, right eye, mild stage: Secondary | ICD-10-CM | POA: Diagnosis not present

## 2021-08-25 DIAGNOSIS — I7389 Other specified peripheral vascular diseases: Secondary | ICD-10-CM | POA: Diagnosis not present

## 2021-08-25 DIAGNOSIS — Z6824 Body mass index (BMI) 24.0-24.9, adult: Secondary | ICD-10-CM | POA: Diagnosis not present

## 2021-08-25 DIAGNOSIS — J449 Chronic obstructive pulmonary disease, unspecified: Secondary | ICD-10-CM | POA: Diagnosis not present

## 2021-08-25 DIAGNOSIS — I872 Venous insufficiency (chronic) (peripheral): Secondary | ICD-10-CM | POA: Diagnosis not present

## 2021-08-25 DIAGNOSIS — F419 Anxiety disorder, unspecified: Secondary | ICD-10-CM | POA: Diagnosis not present

## 2021-08-25 DIAGNOSIS — I10 Essential (primary) hypertension: Secondary | ICD-10-CM | POA: Diagnosis not present

## 2021-09-28 DIAGNOSIS — Z6824 Body mass index (BMI) 24.0-24.9, adult: Secondary | ICD-10-CM | POA: Diagnosis not present

## 2021-09-28 DIAGNOSIS — D2361 Other benign neoplasm of skin of right upper limb, including shoulder: Secondary | ICD-10-CM | POA: Diagnosis not present

## 2021-11-18 ENCOUNTER — Other Ambulatory Visit: Payer: Self-pay

## 2021-11-18 ENCOUNTER — Encounter (HOSPITAL_COMMUNITY): Payer: Self-pay | Admitting: *Deleted

## 2021-11-18 ENCOUNTER — Observation Stay (HOSPITAL_COMMUNITY)
Admission: EM | Admit: 2021-11-18 | Discharge: 2021-12-04 | Disposition: E | Payer: Medicare HMO | Attending: Internal Medicine | Admitting: Internal Medicine

## 2021-11-18 ENCOUNTER — Emergency Department (HOSPITAL_COMMUNITY): Payer: Medicare HMO

## 2021-11-18 DIAGNOSIS — I509 Heart failure, unspecified: Secondary | ICD-10-CM

## 2021-11-18 DIAGNOSIS — I110 Hypertensive heart disease with heart failure: Secondary | ICD-10-CM | POA: Insufficient documentation

## 2021-11-18 DIAGNOSIS — I10 Essential (primary) hypertension: Secondary | ICD-10-CM | POA: Diagnosis present

## 2021-11-18 DIAGNOSIS — Z1152 Encounter for screening for COVID-19: Secondary | ICD-10-CM | POA: Diagnosis not present

## 2021-11-18 DIAGNOSIS — R930 Abnormal findings on diagnostic imaging of skull and head, not elsewhere classified: Secondary | ICD-10-CM | POA: Diagnosis not present

## 2021-11-18 DIAGNOSIS — I469 Cardiac arrest, cause unspecified: Secondary | ICD-10-CM | POA: Diagnosis not present

## 2021-11-18 DIAGNOSIS — J9601 Acute respiratory failure with hypoxia: Secondary | ICD-10-CM | POA: Diagnosis not present

## 2021-11-18 DIAGNOSIS — J9811 Atelectasis: Secondary | ICD-10-CM | POA: Diagnosis not present

## 2021-11-18 DIAGNOSIS — R001 Bradycardia, unspecified: Secondary | ICD-10-CM | POA: Diagnosis not present

## 2021-11-18 DIAGNOSIS — E871 Hypo-osmolality and hyponatremia: Secondary | ICD-10-CM | POA: Diagnosis not present

## 2021-11-18 DIAGNOSIS — Z7982 Long term (current) use of aspirin: Secondary | ICD-10-CM | POA: Insufficient documentation

## 2021-11-18 DIAGNOSIS — I739 Peripheral vascular disease, unspecified: Secondary | ICD-10-CM | POA: Diagnosis present

## 2021-11-18 DIAGNOSIS — R531 Weakness: Secondary | ICD-10-CM | POA: Diagnosis not present

## 2021-11-18 DIAGNOSIS — J90 Pleural effusion, not elsewhere classified: Secondary | ICD-10-CM | POA: Diagnosis not present

## 2021-11-18 DIAGNOSIS — R0689 Other abnormalities of breathing: Secondary | ICD-10-CM | POA: Diagnosis not present

## 2021-11-18 DIAGNOSIS — I959 Hypotension, unspecified: Secondary | ICD-10-CM | POA: Diagnosis not present

## 2021-11-18 DIAGNOSIS — J449 Chronic obstructive pulmonary disease, unspecified: Secondary | ICD-10-CM | POA: Diagnosis not present

## 2021-11-18 DIAGNOSIS — R4182 Altered mental status, unspecified: Secondary | ICD-10-CM | POA: Diagnosis not present

## 2021-11-18 DIAGNOSIS — F17210 Nicotine dependence, cigarettes, uncomplicated: Secondary | ICD-10-CM | POA: Diagnosis not present

## 2021-11-18 DIAGNOSIS — M47814 Spondylosis without myelopathy or radiculopathy, thoracic region: Secondary | ICD-10-CM | POA: Diagnosis not present

## 2021-11-18 DIAGNOSIS — J189 Pneumonia, unspecified organism: Secondary | ICD-10-CM | POA: Insufficient documentation

## 2021-11-18 DIAGNOSIS — R9431 Abnormal electrocardiogram [ECG] [EKG]: Secondary | ICD-10-CM | POA: Diagnosis present

## 2021-11-18 DIAGNOSIS — Z720 Tobacco use: Secondary | ICD-10-CM | POA: Diagnosis present

## 2021-11-18 DIAGNOSIS — Z79899 Other long term (current) drug therapy: Secondary | ICD-10-CM | POA: Diagnosis not present

## 2021-11-18 DIAGNOSIS — Z66 Do not resuscitate: Secondary | ICD-10-CM | POA: Insufficient documentation

## 2021-11-18 DIAGNOSIS — J168 Pneumonia due to other specified infectious organisms: Secondary | ICD-10-CM | POA: Diagnosis not present

## 2021-11-18 HISTORY — DX: Chronic obstructive pulmonary disease, unspecified: J44.9

## 2021-11-18 LAB — RESP PANEL BY RT-PCR (FLU A&B, COVID) ARPGX2
Influenza A by PCR: NEGATIVE
Influenza B by PCR: NEGATIVE
SARS Coronavirus 2 by RT PCR: NEGATIVE

## 2021-11-18 LAB — CBC WITH DIFFERENTIAL/PLATELET
Abs Immature Granulocytes: 0.04 10*3/uL (ref 0.00–0.07)
Basophils Absolute: 0 10*3/uL (ref 0.0–0.1)
Basophils Relative: 1 %
Eosinophils Absolute: 0 10*3/uL (ref 0.0–0.5)
Eosinophils Relative: 0 %
HCT: 54.5 % — ABNORMAL HIGH (ref 36.0–46.0)
Hemoglobin: 17.7 g/dL — ABNORMAL HIGH (ref 12.0–15.0)
Immature Granulocytes: 1 %
Lymphocytes Relative: 8 %
Lymphs Abs: 0.5 10*3/uL — ABNORMAL LOW (ref 0.7–4.0)
MCH: 29.7 pg (ref 26.0–34.0)
MCHC: 32.5 g/dL (ref 30.0–36.0)
MCV: 91.6 fL (ref 80.0–100.0)
Monocytes Absolute: 0.3 10*3/uL (ref 0.1–1.0)
Monocytes Relative: 5 %
Neutro Abs: 5.2 10*3/uL (ref 1.7–7.7)
Neutrophils Relative %: 85 %
Platelets: 344 10*3/uL (ref 150–400)
RBC: 5.95 MIL/uL — ABNORMAL HIGH (ref 3.87–5.11)
RDW: 13.2 % (ref 11.5–15.5)
WBC: 6.1 10*3/uL (ref 4.0–10.5)
nRBC: 0.3 % — ABNORMAL HIGH (ref 0.0–0.2)

## 2021-11-18 LAB — BLOOD GAS, VENOUS
Acid-Base Excess: 2.4 mmol/L — ABNORMAL HIGH (ref 0.0–2.0)
Bicarbonate: 31.3 mmol/L — ABNORMAL HIGH (ref 20.0–28.0)
Drawn by: 7012
O2 Saturation: 29.4 %
Patient temperature: 36.5
pCO2, Ven: 64 mmHg — ABNORMAL HIGH (ref 44–60)
pH, Ven: 7.3 (ref 7.25–7.43)
pO2, Ven: <31 mmHg — CL (ref 32–45)

## 2021-11-18 LAB — BASIC METABOLIC PANEL
Anion gap: 11 (ref 5–15)
BUN: 20 mg/dL (ref 8–23)
CO2: 28 mmol/L (ref 22–32)
Calcium: 8.5 mg/dL — ABNORMAL LOW (ref 8.9–10.3)
Chloride: 88 mmol/L — ABNORMAL LOW (ref 98–111)
Creatinine, Ser: 0.83 mg/dL (ref 0.44–1.00)
GFR, Estimated: >60 mL/min (ref >60)
Glucose, Bld: 133 mg/dL — ABNORMAL HIGH (ref 70–99)
Potassium: 4.1 mmol/L (ref 3.5–5.1)
Sodium: 127 mmol/L — ABNORMAL LOW (ref 135–145)

## 2021-11-18 LAB — BRAIN NATRIURETIC PEPTIDE: B Natriuretic Peptide: 627 pg/mL — ABNORMAL HIGH (ref 0.0–100.0)

## 2021-11-18 LAB — URINALYSIS, ROUTINE W REFLEX MICROSCOPIC
Bacteria, UA: NONE SEEN
Bilirubin Urine: NEGATIVE
Glucose, UA: NEGATIVE mg/dL
Hgb urine dipstick: NEGATIVE
Ketones, ur: 5 mg/dL — AB
Leukocytes,Ua: NEGATIVE
Nitrite: NEGATIVE
Protein, ur: 100 mg/dL — AB
Specific Gravity, Urine: 1.02 (ref 1.005–1.030)
pH: 5 (ref 5.0–8.0)

## 2021-11-18 LAB — PROCALCITONIN: Procalcitonin: 0.1 ng/mL

## 2021-11-18 LAB — TROPONIN I (HIGH SENSITIVITY)
Troponin I (High Sensitivity): 31 ng/L — ABNORMAL HIGH (ref <18)
Troponin I (High Sensitivity): 28 ng/L — ABNORMAL HIGH (ref <18)

## 2021-11-18 LAB — MAGNESIUM: Magnesium: 2.4 mg/dL (ref 1.7–2.4)

## 2021-11-18 MED ORDER — AMLODIPINE BESYLATE 5 MG PO TABS: 5.0000 mg | ORAL_TABLET | Freq: Every day | ORAL | Status: DC

## 2021-11-18 MED ORDER — LOSARTAN POTASSIUM 50 MG PO TABS: 100.0000 mg | ORAL_TABLET | Freq: Every evening | ORAL | Status: DC

## 2021-11-18 MED ORDER — IPRATROPIUM-ALBUTEROL 0.5-2.5 (3) MG/3ML IN SOLN
3.0000 mL | Freq: Once | RESPIRATORY_TRACT | Status: AC
  Administered 2021-11-18: 3 mL via RESPIRATORY_TRACT
  Filled 2021-11-18: qty 3

## 2021-11-18 MED ORDER — BRIMONIDINE TARTRATE-TIMOLOL 0.2-0.5 % OP SOLN: 1.0000 [drp] | Freq: Two times a day (BID) | OPHTHALMIC | Status: DC

## 2021-11-18 MED ORDER — ASPIRIN 81 MG PO TBEC: 81.0000 mg | DELAYED_RELEASE_TABLET | Freq: Every day | ORAL | Status: DC

## 2021-11-18 MED ORDER — ACETAMINOPHEN 650 MG RE SUPP: 650.0000 mg | Freq: Four times a day (QID) | RECTAL | Status: DC | PRN

## 2021-11-18 MED ORDER — BRIMONIDINE TARTRATE 0.2 % OP SOLN
1.0000 [drp] | Freq: Two times a day (BID) | OPHTHALMIC | Status: DC
  Administered 2021-11-18: 1 [drp] via OPHTHALMIC
  Filled 2021-11-18: qty 5

## 2021-11-18 MED ORDER — ACETAMINOPHEN 325 MG PO TABS
650.0000 mg | ORAL_TABLET | Freq: Four times a day (QID) | ORAL | Status: DC | PRN
  Administered 2021-11-18: 650 mg via ORAL
  Filled 2021-11-18: qty 2

## 2021-11-18 MED ORDER — ORAL CARE MOUTH RINSE: 15.0000 mL | OROMUCOSAL | Status: DC | PRN

## 2021-11-18 MED ORDER — GUAIFENESIN-DM 100-10 MG/5ML PO SYRP
10.0000 mL | ORAL_SOLUTION | Freq: Three times a day (TID) | ORAL | Status: DC
  Administered 2021-11-18: 10 mL via ORAL
  Filled 2021-11-18: qty 10

## 2021-11-18 MED ORDER — SODIUM CHLORIDE 0.9 % IV SOLN
1.0000 g | Freq: Once | INTRAVENOUS | Status: AC
  Administered 2021-11-18: 1 g via INTRAVENOUS
  Filled 2021-11-18: qty 10

## 2021-11-18 MED ORDER — IPRATROPIUM-ALBUTEROL 0.5-2.5 (3) MG/3ML IN SOLN: 3.0000 mL | RESPIRATORY_TRACT | Status: DC | PRN

## 2021-11-18 MED ORDER — METHYLPREDNISOLONE SODIUM SUCC 125 MG IJ SOLR
62.5000 mg | Freq: Once | INTRAMUSCULAR | Status: AC
  Administered 2021-11-18: 62.5 mg via INTRAVENOUS
  Filled 2021-11-18: qty 2

## 2021-11-18 MED ORDER — POLYETHYLENE GLYCOL 3350 17 G PO PACK: 17.0000 g | PACK | Freq: Every day | ORAL | Status: DC | PRN

## 2021-11-18 MED ORDER — SODIUM CHLORIDE 0.9 % IV SOLN
500.0000 mg | Freq: Once | INTRAVENOUS | Status: AC
  Administered 2021-11-18: 500 mg via INTRAVENOUS
  Filled 2021-11-18: qty 5

## 2021-11-18 MED ORDER — FUROSEMIDE 10 MG/ML IJ SOLN
40.0000 mg | Freq: Once | INTRAMUSCULAR | Status: AC
  Administered 2021-11-18: 40 mg via INTRAVENOUS
  Filled 2021-11-18: qty 4

## 2021-11-18 MED ORDER — FUROSEMIDE 10 MG/ML IJ SOLN: 40.0000 mg | Freq: Two times a day (BID) | INTRAMUSCULAR | Status: DC

## 2021-11-18 MED ORDER — ALPRAZOLAM 0.5 MG PO TABS
0.5000 mg | ORAL_TABLET | Freq: Three times a day (TID) | ORAL | Status: DC
  Administered 2021-11-18: 0.5 mg via ORAL
  Filled 2021-11-18: qty 1

## 2021-11-18 MED ORDER — IPRATROPIUM-ALBUTEROL 0.5-2.5 (3) MG/3ML IN SOLN
3.0000 mL | Freq: Three times a day (TID) | RESPIRATORY_TRACT | Status: DC
  Administered 2021-11-18: 3 mL via RESPIRATORY_TRACT
  Filled 2021-11-18: qty 3

## 2021-11-18 MED ORDER — ENOXAPARIN SODIUM 40 MG/0.4ML IJ SOSY: 40.0000 mg | PREFILLED_SYRINGE | INTRAMUSCULAR | Status: DC

## 2021-11-18 MED ORDER — TIMOLOL MALEATE 0.5 % OP SOLN
1.0000 [drp] | Freq: Two times a day (BID) | OPHTHALMIC | Status: DC
  Administered 2021-11-18: 1 [drp] via OPHTHALMIC
  Filled 2021-11-18: qty 5

## 2021-11-18 NOTE — H&P (Signed)
History and Physical    Tina Tucker MOQ:[id number] DOB: 06-Jun-1932 DOA: 11/22/2021  PCP: Neale Burly, MD   Patient coming from: Home  I have personally briefly reviewed patient's old medical records in [place]  Chief Complaint: Difficulty breathing  HPI: Tina Tucker is a 88 y.o. female with medical history significant for COPD, hypertension, peripheral artery disease. Patient was brought to the ED via EMS reports of difficulty breathing and generalized weakness.  On my evaluation, patient is awake alert oriented x4.  Started about a month and a half ago.,  When she started sitting in her recliner more frequently, weakness progressed, over the past 3 days, she has been mostly bedbound and not wanting to get up.  Any degree of activity would make her significantly short of breath, and her hands and face would turn blue.  Family reported that patient has been more quiet/reserved lately.  Over the past 3 days she has also had worsening cough.  Per Triage notes- EMS reports on scene patient was alert and oriented, she changed her clothes and smoked a cigarette while EMS got up to the stretcher.  O2 sats were 84% and she was placed on 4 L.  ED Course: O2 sats 98 to 100% on room air at rest, with any form of activity, O2 sats dropped to 85%.  Temperature 97.7.  Heart rate 85-104.  Respirate rate 18-26.  Blood pressure systolic 1 35-465. Sodium 127.  BNP 627.  Troponin 31 > 88.  VBG shows pH of 7.3, PCO2 of 64..  2 view chest x-ray - left lower lung opacity suggesting atelectasis with moderate-sized pleural effusion, underlying airspace disease can not be excluded.  Right base atelectasis, small right pleural effusion. CT head-left temporal low-density with possible sulcal effacement suggesting edema or acute infarction.  MRI recommended. EDP consulted neurologist Dr. Curly Shores, recommended admission, get MRI W/Wo contrast and CTA head and neck when patient is stabilized and able  to lay flat.  Review of Systems: As per HPI all other systems reviewed and negative.  Past Medical History:  Diagnosis Date   COPD (chronic obstructive pulmonary disease) (HCC)    Hypertension    PAD (peripheral artery disease) (HCC)     Past Surgical History:  Procedure Laterality Date   ABDOMINAL AORTOGRAM W/LOWER EXTREMITY Bilateral 05/27/2020   Procedure: ABDOMINAL AORTOGRAM W/LOWER EXTREMITY;  Surgeon: Serafina Mitchell, MD;  Location: [facility];  Service: Cardiovascular;  Laterality: Bilateral;   CYST EXCISION       reports that she has been smoking cigarettes. She has been smoking an average of 2 packs per day. She has never used smokeless tobacco. She reports that she does not currently use alcohol. She reports that she does not currently use drugs.  Allergies  Allergen Reactions   Lotensin [Benazepril] Other (See Comments)    Unsure of reaction    Family History  Problem Relation Age of Onset   Cancer - Colon Mother    Heart attack Father    Prior to Admission medications   Medication Sig Start Date End Date Taking? Authorizing Provider  ALPRAZolam Duanne Moron) 0.5 MG tablet Take 0.5 mg by mouth in the morning, at noon, and at bedtime. 02/27/20  Yes [provider]  ALPRAZolam Duanne Moron) 1 MG tablet Take 1 mg by mouth at bedtime. 10/23/21  Yes [provider]  amLODipine (NORVASC) 5 MG tablet Take 5 mg by mouth in the morning. 03/14/20  Yes [provider]  aspirin EC 81 MG tablet Take 81 mg by mouth in the morning. Swallow whole.   Yes [provider]  COMBIGAN 0.2-0.5 % ophthalmic solution Place 1 drop into both eyes every 12 (twelve) hours. 03/14/20  Yes [provider]  COMBIVENT RESPIMAT 20-100 MCG/ACT AERS respimat Inhale 1 puff into the lungs every 6 (six) hours as needed for wheezing or shortness of breath. 03/04/20  Yes [provider]  furosemide (LASIX) 20 MG tablet Take 20 mg by mouth in the morning. 03/04/20   Yes [provider]  losartan (COZAAR) 100 MG tablet Take 100 mg by mouth every evening. 03/14/20  Yes [provider]  Omega-3 Fatty Acids (FISH OIL) 1000 MG CAPS Take 1,000 mg by mouth in the morning, at noon, and at bedtime.   Yes [provider]  rosuvastatin (CRESTOR) 20 MG tablet Take 20 mg by mouth daily. 09/21/21  Yes [provider]  Travoprost, BAK Free, (TRAVATAN) 0.004 % SOLN ophthalmic solution Place 1 drop into both eyes at bedtime. 03/14/20  Yes [provider]    Physical Exam: Vitals:   11/08/2021 1830 11/10/2021 1900 12/03/2021 2015 11/17/2021 2022  BP: (!) 138/58 (!) 115/54 (!) 122/49   Pulse: 94 93 92   Resp: (!) 26 (!) 22 18   Temp:      TempSrc:      SpO2: 98% 98% 98% 100%  Weight:      Height:        Constitutional: NAD, calm, comfortable Vitals:   11/26/2021 1830 11/08/2021 1900 11/11/2021 2015 11/08/2021 2022  BP: (!) 138/58 (!) 115/54 (!) 122/49   Pulse: 94 93 92   Resp: (!) 26 (!) 22 18   Temp:      TempSrc:      SpO2: 98% 98% 98% 100%  Weight:      Height:       Eyes: PERRL, lids and conjunctivae normal ENMT: Mucous membranes are moist. Neck: normal, supple, no masses, no thyromegaly Respiratory: On room air on my evaluation, clear to auscultation bilaterally, no wheezing, no crackles. Normal respiratory effort. No accessory muscle use.  Cardiovascular: Regular rate and rhythm, no murmurs / rubs / gallops.  Trace bilateral lower extremity swelling, worse on the left.   Extremities warm. Abdomen: no tenderness, no masses palpated. No hepatosplenomegaly. Bowel sounds positive.  Musculoskeletal: No cyanosis.  No joint deformity upper and lower extremities.  Skin: no rashes, lesions, ulcers. No induration Neurologic: No facial asymmetry, EOMI, speech clear and fluent without evidence of aphasia, 4+/5 strength in all extremities, sensation intact.  Psychiatric: Normal judgment and insight. Alert and oriented x 3. Normal mood.    Labs on Admission: I have personally reviewed following labs and imaging studies  CBC: Recent Labs  Lab 11/04/2021 1628  WBC 6.1  NEUTROABS 5.2  HGB 17.7*  HCT 54.5*  MCV 91.6  PLT 563   Basic Metabolic Panel: Recent Labs  Lab 11/26/2021 1628  NA 127*  K 4.1  CL 88*  CO2 28  GLUCOSE 133*  BUN 20  CREATININE 0.83  CALCIUM 8.5*   Urine analysis:    Component Value Date/Time   COLORURINE YELLOW 11/27/2021 [place] 12/03/2021 1744   LABSPEC 1.020 11/07/2021 National 5.0 11/20/2021 [place] 11/17/2021 1744   HGBUR NEGATIVE 11/07/2021 1744   BILIRUBINUR NEGATIVE 11/28/2021 1744   KETONESUR 5 (A) 11/05/2021 1744   PROTEINUR 100 (A) 11/25/2021 1744  UROBILINOGEN 0.2 06/10/2010 1230   NITRITE NEGATIVE 11/25/2021 1744   LEUKOCYTESUR NEGATIVE 11/06/2021 1744    Radiological Exams on Admission: CT Head Wo Contrast  Result Date: 11/07/2021 CLINICAL DATA:  Altered mental status.  Weakness. EXAM: CT HEAD WITHOUT CONTRAST TECHNIQUE: Contiguous axial images were obtained from the base of the skull through the vertex without intravenous contrast. RADIATION DOSE REDUCTION: This exam was performed according to the departmental dose-optimization program which includes automated exposure control, adjustment of the mA and/or kV according to patient size and/or use of iterative reconstruction technique. COMPARISON:  None Available. FINDINGS: Brain: Mild chronic ischemic white matter disease is noted. Ventricular size is within normal limits. Left temporal low density is noted with possible sulcal effacement suggesting edema or acute infarction. No definite hemorrhage or midline shift is noted. Vascular: No hyperdense vessel or unexpected calcification. Skull: Normal. Negative for fracture or focal lesion. Sinuses/Orbits: No acute finding. Other: None. IMPRESSION: Left temporal low density is noted with possible sulcal effacement suggesting edema or acute  infarction. MRI is recommended for further evaluation. Electronically Signed   By: Marijo Conception M.D.   On: 11/17/2021 18:39   DG Chest 2 View  Result Date: 12/01/2021 CLINICAL DATA:  Generalized weakness. EXAM: CHEST - 2 VIEW COMPARISON:  No recent examination available for comparison. FINDINGS: The heart is enlarged. Prominent atherosclerotic calcification of aorta. Left lower lung opacity suggesting atelectasis with moderate-sized pleural effusion. Right basilar atelectasis with small right pleural effusion. Osteopenia and moderate thoracic spondylosis. IMPRESSION: 1. Cardiomegaly. 2. Left lower lung opacity suggesting atelectasis with moderate-sized pleural effusion, underlying airspace disease can not be excluded. 3. Right basilar atelectasis with small right pleural effusion. Electronically Signed   By: Keane Police D.O.   On: 11/17/2021 16:00    EKG: Independently reviewed.  Sinus rhythm rate 99, multiple PVCs.  QTc prolonged 507.  Assessment/Plan Principal Problem:   Acute respiratory failure with hypoxia (HCC) Active Problems:   Acute CHF (congestive heart failure) (HCC)   Prolonged QT interval   Tobacco abuse   HTN (hypertension)   Peripheral artery disease (HCC)   Hyponatremia   COPD (chronic obstructive pulmonary disease) (HCC)   Abnormal head CT  Assessment and Plan: * Acute respiratory failure with hypoxia (HCC) Hypoxic and dyspneic with activity.  On room air sats are greater than 98%.   With activity sats drop to 85%.  She is not on home O2.  Significant smoking history, currently smokes 2 pack of cigarettes daily.  Lung exam unremarkable, chest x-ray showing left lower lobe opacity-atelectasis, moderate pleural effusion pneumonia not excluded, small right pleural effusion.  VBG shows pH of 7.3, PCO2 of 64.  Possibly new onset CHF as etiology.   Acute CHF (congestive heart failure) (HCC) Type- as yet unspecified.  With hypoxia.  Has trace bilateral lower extremity  swelling, chest x-ray suggesting moderate left pleural effusion, small right pleural effusion, BNP elevated at 627.  No prior to compare.  Troponin 31 > 28. -IV Lasix 40 twice daily -Obtain echo -Strict input output, daily weights, daily BMP  Prolonged QT interval QTc prolonged at 507. New compared to last EKG. -Check Magnesium  Abnormal head CT Left temporal low density is noted with possible sulcal effacement suggesting edema or acute infarction. MRI is recommended for further evaluation.  Patient has no focal neurologic deficits. - EDP talked to Dr. Daiva Nakayama off on CTA head and neck if she is able to lie flat to allow for diagnostic images, and get  MRI brain with and without contrast when stable, due to potential for malignancy given her smoking history and new pleural effusion.  If unable to obtain MRI due to kyphosis, repeat CT head. -Echo -Lipid panel, A1c  COPD (chronic obstructive pulmonary disease) (HCC) No wheezing or rhonchi, okay air entry bilaterally, has severe kyphosis.  Chest x-ray shows pleural effusion, atelectasis cannot exclude pneumonia.  She is afebrile without leukocytosis. -DuoNebs as needed and scheduled for now -Hold off on further steroids for now pending clinical course, 125 mg Solu-Medrol given in ED. -Will also hold off on further antibiotics, ceftriaxone and azithromycin given in ED -Mucolytic's -Flutter valve -Check procalcitonin  Hyponatremia NA 127, last checked a year ago 138.  Likely hypervolemic hyponatremia.  Has also had poor oral intake.  And is on Lasix 20 mg daily. - Monitor with diuresis -Urine sodium, urine osmolality, serum osmolality  HTN (hypertension) Stable. -Resume Norvasc, losartan   DVT prophylaxis: Lovenox Code Status: DNR, confirmed with patient at bedside. Family Communication: None at bedside. Disposition Plan: ~ 2 days Consults called: None Admission status: Obs tele   Author: Bethena Roys, MD 11/28/2021  9:43 PM  For on call review [email or web address].

## 2021-11-18 NOTE — Assessment & Plan Note (Addendum)
Type- as yet unspecified.  With hypoxia.  Has trace bilateral lower extremity swelling, chest x-ray suggesting moderate left pleural effusion, small right pleural effusion, BNP elevated at 627.  No prior to compare.  Troponin 31 > 28. -IV Lasix 40 twice daily -Obtain echo -Strict input output, daily weights, daily BMP

## 2021-11-18 NOTE — Assessment & Plan Note (Addendum)
No wheezing or rhonchi, okay air entry bilaterally, has severe kyphosis.  Chest x-ray shows pleural effusion, atelectasis cannot exclude pneumonia.  She is afebrile without leukocytosis. -DuoNebs as needed and scheduled for now -Hold off on further steroids for now pending clinical course, 125 mg Solu-Medrol given in ED. -Will also hold off on further antibiotics, ceftriaxone and azithromycin given in ED -Mucolytic's -Flutter valve -Check procalcitonin

## 2021-11-18 NOTE — Assessment & Plan Note (Addendum)
Left temporal low density is noted with possible sulcal effacement suggesting edema or acute infarction. MRI is recommended for further evaluation.  Patient has no focal neurologic deficits. - EDP talked to Dr. Daiva Nakayama off on CTA head and neck if she is able to lie flat to allow for diagnostic images, and get MRI brain with and without contrast when stable, due to potential for malignancy given her smoking history and new pleural effusion.  If unable to obtain MRI due to kyphosis, repeat CT head. -Echo -Lipid panel, A1c

## 2021-11-18 NOTE — ED Provider Notes (Addendum)
[facility] EMERGENCY DEPARTMENT Provider Note   CSN: [id number] Arrival date & time: 11/06/2021  1339     History  Chief Complaint  Patient presents with   Weakness    Tina Tucker is a 88 y.o. female.  Patient as above with significant medical history as below, including COPD, PAD, HTN,  who presents to the ED with complaint of weakness, dib, poor PO. Per family pt with weakness, dib, poor PO last 3 days. Family reports with minimal exertion she will become dyspneic and hands/face will turn blue. Worsening cough last 3 days productive w/ white sputum. Continues to smoke 2ppd, no home O2. No Cp or n/v, no syncope. Family does report reduced urination and reduced PO. No diarrhea. No fevers/chills/sick contacts. Has been essentially bound to her recliner over the last 1-2 months but in the last 3 days has been unable to get out of her bed without sig assistance. Difficulty holding objects last 3 days. No falls or head injuries, no slurred speech, no unilateral numbness or weakness reported. Does report numbness to her hands and feet chronically but unchanged. Family feels she has been more quiet lately/reserved.     Past Medical History:  Diagnosis Date   COPD (chronic obstructive pulmonary disease) (HCC)    Hypertension    PAD (peripheral artery disease) (HCC)     Past Surgical History:  Procedure Laterality Date   ABDOMINAL AORTOGRAM W/LOWER EXTREMITY Bilateral 05/27/2020   Procedure: ABDOMINAL AORTOGRAM W/LOWER EXTREMITY;  Surgeon: Serafina Mitchell, MD;  Location: [facility];  Service: Cardiovascular;  Laterality: Bilateral;   CYST EXCISION       The history is provided by the patient and a relative. No language interpreter was used.  Weakness Associated symptoms: cough and shortness of breath   Associated symptoms: no abdominal pain, no chest pain, no dysuria, no fever, no headaches and no nausea        Home Medications Prior to Admission medications    Medication Sig Start Date End Date Taking? Authorizing Provider  ALPRAZolam Duanne Moron) 0.5 MG tablet Take 0.5 mg by mouth in the morning, at noon, and at bedtime. 02/27/20  Yes [provider]  ALPRAZolam Duanne Moron) 1 MG tablet Take 1 mg by mouth at bedtime. 10/23/21  Yes [provider]  amLODipine (NORVASC) 5 MG tablet Take 5 mg by mouth in the morning. 03/14/20  Yes [provider]  aspirin EC 81 MG tablet Take 81 mg by mouth in the morning. Swallow whole.   Yes [provider]  COMBIGAN 0.2-0.5 % ophthalmic solution Place 1 drop into both eyes every 12 (twelve) hours. 03/14/20  Yes [provider]  COMBIVENT RESPIMAT 20-100 MCG/ACT AERS respimat Inhale 1 puff into the lungs every 6 (six) hours as needed for wheezing or shortness of breath. 03/04/20  Yes [provider]  furosemide (LASIX) 20 MG tablet Take 20 mg by mouth in the morning. 03/04/20  Yes [provider]  losartan (COZAAR) 100 MG tablet Take 100 mg by mouth every evening. 03/14/20  Yes [provider]  Omega-3 Fatty Acids (FISH OIL) 1000 MG CAPS Take 1,000 mg by mouth in the morning, at noon, and at bedtime.   Yes [provider]  rosuvastatin (CRESTOR) 20 MG tablet Take 20 mg by mouth daily. 09/21/21  Yes [provider]  Travoprost, BAK Free, (TRAVATAN) 0.004 % SOLN ophthalmic solution Place 1 drop into both eyes at bedtime. 03/14/20  Yes [provider]  Allergies    Lotensin [benazepril]    Review of Systems   Review of Systems  Constitutional:  Positive for appetite change and fatigue. Negative for activity change and fever.  HENT:  Negative for facial swelling and trouble swallowing.   Eyes:  Negative for discharge and redness.  Respiratory:  Positive for cough and shortness of breath.   Cardiovascular:  Negative for chest pain and palpitations.  Gastrointestinal:  Negative for abdominal pain and nausea.  Genitourinary:  Negative  for dysuria and flank pain.  Musculoskeletal:  Negative for back pain and gait problem.  Skin:  Negative for pallor and rash.  Neurological:  Positive for weakness. Negative for syncope and headaches.    Physical Exam Updated Vital Signs BP (!) 115/55   Pulse 90   Temp 98.2 F (36.8 C) (Oral)   Resp 20   Ht '5\' 2"'$  (1.575 m)   Wt 60.7 kg   SpO2 95%   BMI 24.48 kg/m  Physical Exam Vitals and nursing note reviewed.  Constitutional:      General: She is not in acute distress.    Appearance: Normal appearance.     Comments: frail  HENT:     Head: Normocephalic and atraumatic.     Right Ear: External ear normal.     Left Ear: External ear normal.     Nose: Nose normal.     Mouth/Throat:     Mouth: Mucous membranes are moist.  Eyes:     General: No scleral icterus.       Right eye: No discharge.        Left eye: No discharge.  Cardiovascular:     Rate and Rhythm: Normal rate and regular rhythm.     Pulses: Normal pulses.     Heart sounds: Normal heart sounds.  Pulmonary:     Effort: Pulmonary effort is normal. No tachypnea or respiratory distress.     Breath sounds: Wheezing present.  Abdominal:     General: Abdomen is flat.     Palpations: Abdomen is soft.     Tenderness: There is no abdominal tenderness. There is no guarding or rebound.  Musculoskeletal:        General: Normal range of motion.     Cervical back: Normal range of motion.     Right lower leg: No edema.     Left lower leg: No edema.  Skin:    General: Skin is warm and dry.     Capillary Refill: Capillary refill takes less than 2 seconds.  Neurological:     Mental Status: She is alert and oriented to person, place, and time.     GCS: GCS eye subscore is 4. GCS verbal subscore is 5. GCS motor subscore is 6.     Cranial Nerves: Cranial nerves 2-12 are intact.     Sensory: Sensation is intact.     Motor: Motor function is intact.     Coordination: Coordination is intact.     Comments: Gait not tested  2/2 pt safety  sleepy  Psychiatric:        Mood and Affect: Mood normal.        Behavior: Behavior normal.     ED Results / Procedures / Treatments   Labs (all labs ordered are listed, but only abnormal results are displayed) Labs Reviewed  BASIC METABOLIC PANEL - Abnormal; Notable for the following components:      Result Value   Sodium 127 (*)    Chloride  88 (*)    Glucose, Bld 133 (*)    Calcium 8.5 (*)    All other components within normal limits  BRAIN NATRIURETIC PEPTIDE - Abnormal; Notable for the following components:   B Natriuretic Peptide 627.0 (*)    All other components within normal limits  CBC WITH DIFFERENTIAL/PLATELET - Abnormal; Notable for the following components:   RBC 5.95 (*)    Hemoglobin 17.7 (*)    HCT 54.5 (*)    nRBC 0.3 (*)    Lymphs Abs 0.5 (*)    All other components within normal limits  URINALYSIS, ROUTINE W REFLEX MICROSCOPIC - Abnormal; Notable for the following components:   Ketones, ur 5 (*)    Protein, ur 100 (*)    All other components within normal limits  BLOOD GAS, VENOUS - Abnormal; Notable for the following components:   pCO2, Ven 64 (*)    pO2, Ven <31 (*)    Bicarbonate 31.3 (*)    Acid-Base Excess 2.4 (*)    All other components within normal limits  TROPONIN I (HIGH SENSITIVITY) - Abnormal; Notable for the following components:   Troponin I (High Sensitivity) 31 (*)    All other components within normal limits  TROPONIN I (HIGH SENSITIVITY) - Abnormal; Notable for the following components:   Troponin I (High Sensitivity) 28 (*)    All other components within normal limits  RESP PANEL BY RT-PCR (FLU A&B, COVID) ARPGX2  PROCALCITONIN  MAGNESIUM  OSMOLALITY, URINE  OSMOLALITY  SODIUM, URINE, RANDOM  BASIC METABOLIC PANEL  CBC  HEMOGLOBIN A1C  LIPID PANEL    EKG EKG Interpretation  Date/Time:  Wednesday November 18 2021 14:02:55 EST Ventricular Rate:  99 PR Interval:  145 QRS Duration: 128 QT  Interval:  391 QTC Calculation: 507 R Axis:   121 Text Interpretation: Sinus rhythm IVCD, consider atypical RBBB Probable lateral infarct, age indeterminate Baseline wander in lead(s) V2 V6 Interpretation limited secondary to artifact Confirmed by Wynona Dove ([id number]) on 11/16/2021 3:34:50 PM  Radiology CT Head Wo Contrast  Result Date: 11/25/2021 CLINICAL DATA:  Altered mental status.  Weakness. EXAM: CT HEAD WITHOUT CONTRAST TECHNIQUE: Contiguous axial images were obtained from the base of the skull through the vertex without intravenous contrast. RADIATION DOSE REDUCTION: This exam was performed according to the departmental dose-optimization program which includes automated exposure control, adjustment of the mA and/or kV according to patient size and/or use of iterative reconstruction technique. COMPARISON:  None Available. FINDINGS: Brain: Mild chronic ischemic white matter disease is noted. Ventricular size is within normal limits. Left temporal low density is noted with possible sulcal effacement suggesting edema or acute infarction. No definite hemorrhage or midline shift is noted. Vascular: No hyperdense vessel or unexpected calcification. Skull: Normal. Negative for fracture or focal lesion. Sinuses/Orbits: No acute finding. Other: None. IMPRESSION: Left temporal low density is noted with possible sulcal effacement suggesting edema or acute infarction. MRI is recommended for further evaluation. Electronically Signed   By: Marijo Conception M.D.   On: 11/08/2021 18:39   DG Chest 2 View  Result Date: 11/14/2021 CLINICAL DATA:  Generalized weakness. EXAM: CHEST - 2 VIEW COMPARISON:  No recent examination available for comparison. FINDINGS: The heart is enlarged. Prominent atherosclerotic calcification of aorta. Left lower lung opacity suggesting atelectasis with moderate-sized pleural effusion. Right basilar atelectasis with small right pleural effusion. Osteopenia and moderate thoracic  spondylosis. IMPRESSION: 1. Cardiomegaly. 2. Left lower lung opacity suggesting atelectasis with moderate-sized pleural effusion, underlying airspace  disease can not be excluded. 3. Right basilar atelectasis with small right pleural effusion. Electronically Signed   By: Keane Police D.O.   On: 11/21/2021 16:00    Procedures .Critical Care  Performed by: Jeanell Sparrow, DO Authorized by: Jeanell Sparrow, DO   Critical care provider statement:    Critical care time (minutes):  30   Critical care time was exclusive of:  Separately billable procedures and treating other patients   Critical care was necessary to treat or prevent imminent or life-threatening deterioration of the following conditions:  CNS failure or compromise and cardiac failure   Critical care was time spent personally by me on the following activities:  Development of treatment plan with patient or surrogate, discussions with consultants, evaluation of patient's response to treatment, examination of patient, ordering and review of laboratory studies, ordering and review of radiographic studies, ordering and performing treatments and interventions, pulse oximetry, re-evaluation of patient's condition, review of old charts and obtaining history from patient or surrogate   Care discussed with: admitting provider       Medications Ordered in ED Medications  ipratropium-albuterol (DUONEB) 0.5-2.5 (3) MG/3ML nebulizer solution 3 mL (has no administration in time range)  ALPRAZolam Duanne Moron) tablet 0.5 mg (0.5 mg Oral Given 11/11/2021 2328)  amLODipine (NORVASC) tablet 5 mg (has no administration in time range)  aspirin EC tablet 81 mg (has no administration in time range)  losartan (COZAAR) tablet 100 mg (has no administration in time range)  guaiFENesin-dextromethorphan (ROBITUSSIN DM) 100-10 MG/5ML syrup 10 mL (10 mLs Oral Given 11/28/2021 2328)  furosemide (LASIX) injection 40 mg (has no administration in time range)  enoxaparin  (LOVENOX) injection 40 mg (has no administration in time range)  acetaminophen (TYLENOL) tablet 650 mg (650 mg Oral Given 12/02/2021 2328)    Or  acetaminophen (TYLENOL) suppository 650 mg ( Rectal See Alternative 11/25/2021 2328)  polyethylene glycol (MIRALAX / GLYCOLAX) packet 17 g (has no administration in time range)  ipratropium-albuterol (DUONEB) 0.5-2.5 (3) MG/3ML nebulizer solution 3 mL (3 mLs Nebulization Given 12/03/2021 2310)  brimonidine (ALPHAGAN) 0.2 % ophthalmic solution 1 drop (1 drop Both Eyes Given 11/28/2021 2328)  timolol (TIMOPTIC) 0.5 % ophthalmic solution 1 drop (1 drop Both Eyes Given 11/25/2021 2328)  Oral care mouth rinse (has no administration in time range)  cefTRIAXone (ROCEPHIN) 1 g in sodium chloride 0.9 % 100 mL IVPB (0 g Intravenous Stopped 11/20/2021 2043)  azithromycin (ZITHROMAX) 500 mg in sodium chloride 0.9 % 250 mL IVPB (0 mg Intravenous Stopped 11/23/2021 2154)  methylPREDNISolone sodium succinate (SOLU-MEDROL) 125 mg/2 mL injection 62.5 mg (62.5 mg Intravenous Given 11/09/2021 2007)  ipratropium-albuterol (DUONEB) 0.5-2.5 (3) MG/3ML nebulizer solution 3 mL (3 mLs Nebulization Given 11/16/2021 2022)  furosemide (LASIX) injection 40 mg (40 mg Intravenous Given 11/26/2021 2043)    ED Course/ Medical Decision Making/ A&P Clinical Course as of 2021-11-29 0026  Wed Nov 18, 2021  1948 PSI is 127, risk class 4 [SG]  2019 Spoke w/ Dr Curly Shores, no intervention at this point, further risk stratification would be helpful with echo. Consider rpt CTH in 24 hours if needed.  [SG]    Clinical Course User Index [SG] Jeanell Sparrow, DO                           Medical Decision Making Amount and/or Complexity of Data Reviewed Labs: ordered. Radiology: ordered.  Risk Prescription drug management. Decision regarding hospitalization.  This patient presents to the ED with chief complaint(s) of weakness, dib, poor po with pertinent past medical history of copd, tobacco which further  complicates the presenting complaint. The complaint involves an extensive differential diagnosis and also carries with it a high risk of complications and morbidity.    The differential diagnosis includes but not limited to In my evaluation of this patient's dyspnea my DDx includes, but is not limited to, pneumonia, pulmonary embolism, pneumothorax, pulmonary edema, metabolic acidosis, asthma, COPD, cardiac cause, anemia, anxiety, etc.  . Serious etiologies were considered.   The initial plan is to screening labs/imaging   Additional history obtained: Additional history obtained from family Records reviewed Primary Care Documents and home meds [place] labs interpretation:  The following labs were independently interpreted: Bnp > 600, trop elev Sodium low 127 VBG with elev co2, ph stable; likely chronic retainer   Independent visualization of imaging: - I independently visualized the following imaging with scope of interpretation limited to determining acute life threatening conditions related to emergency care:CTH  CXR, which revealed [place] with ?temporal infarct, CXR with effusion/pna  Cardiac monitoring was reviewed and interpreted by myself which shows NSR  Treatment and Reassessment: Rocephin/azithro Lasix Duoneb Steroids >> improved  Consultation: - Consulted or discussed management/test interpretation w/ external professional: Dr Curly Shores; pt unabel to tolerate MRI 2/2 kyphosis with diff lying down flat, she has non-focal exam, outside any sort of treatment window, recommend risk stratification, possible rpt CTH in 24 hours to see if any change. If can tolerate MRI/ CTA tomorrow then would be reasonable to get o/w favor rpt non con CTH in 24 hours, recommend consult neuro tomorrow in daytime for eval at [facility], no need for emergent txfr to [facility]. Recommend echo  Consideration for admission or further workup: Admission was considered    Pt with dib, hypoxia w/ exertion,  found to have pna with elev risk score PSI, ?new onset chf; never had echo, ?copd exacerbation. Also with possible acute cva. Recommend admission, pt/family agreeable. Pt is HDS, she is placed on 2LNC, breathing comfortably at rest w/o conversational dyspnea or hypoxia.     Social Determinants of health: Counseled patient for approximately 3 minutes regarding smoking cessation. Discussed risks of smoking and how they applied and affected their visit here today. Patient not ready to quit at this time, however will follow up with their primary doctor when they are.   CPT code: [id number]: intermediate counseling for smoking cessation   Social History   Tobacco Use   Smoking status: Every Day    Packs/day: 2.00    Types: Cigarettes   Smokeless tobacco: Never  Vaping Use   Vaping Use: Former  Substance Use Topics   Alcohol use: Not Currently   Drug use: Not Currently            Final Clinical Impression(s) / ED Diagnoses Final diagnoses:  Weakness  Pneumonia due to infectious organism, unspecified laterality, unspecified part of lung  Hyponatremia  Acute congestive heart failure, unspecified heart failure type [facility])    Rx / DC Orders ED Discharge Orders     None         Jeanell Sparrow, DO 14-Dec-2021 0026    Jeanell Sparrow, DO 11/20/21 [phone]

## 2021-11-18 NOTE — Assessment & Plan Note (Addendum)
Hypoxic and dyspneic with activity.  On room air sats are greater than 98%.   With activity sats drop to 85%.  She is not on home O2.  Significant smoking history, currently smokes 2 pack of cigarettes daily.  Lung exam unremarkable, chest x-ray showing left lower lobe opacity-atelectasis, moderate pleural effusion pneumonia not excluded, small right pleural effusion.  VBG shows pH of 7.3, PCO2 of 64.  Possibly new onset CHF as etiology.

## 2021-11-18 NOTE — ED Notes (Signed)
While doing in and out, pt repositioned and new warm blankets given to pt and family members. Nurse aware.

## 2021-11-18 NOTE — Assessment & Plan Note (Signed)
QTc prolonged at 507. New compared to last EKG. -Check Magnesium

## 2021-11-18 NOTE — Plan of Care (Signed)
Discussed with Dr. Pearline Cables.  Patient with history of heavy smoking, COPD, peripheral artery disease, hypertension, severe peripheral artery disease with plan for potential femoral to popliteal revascularization pending clinical course.  Worsening generalized weakness over 2 months with 3 days of being bound to her recliner.  Asked to comment on imaging findings  Left temporal low density is noted with possible sulcal effacement suggesting edema or acute infarction. MRI is recommended for further evaluation.  Agree with radiology that imaging could be concerning for possible stroke but given lack of any focality on ED provider's examination and on history provided by family, as well as chronicity of symptoms for at least 2 to 3 days prior to presentation, no acute intervention would be indicated Differential is chronic microvascular disease  At this time due to her respiratory status quite difficult for her to lay flat  Note new onset hyponatremia to 127, and left pleural effusion on chest x-ray Creatinine 0.83 with GFR greater than 60 Mild hyperglycemia to 133  Recommend patient be stabilized from a cardiopulmonary perspective.  MRI can then be obtained once she has been stabilized, recommend with and without contrast due to potential for malignancy given her smoking history and new pleural effusion.  If her kyphosis prevents MRI from being obtained, can obtain repeat head CT  Given her risk factors, reasonable to in tandem pursue stroke work-up with A1c, lipid panel, echocardiogram, carotid duplex Hold off on CTA head and neck until she is able to lay flat (to allow diagnostic images) and if order creatinine remains stable Primary team should reach out to Dr. Hortense Ramal for consultation if a full consultation is needed pending patient's preliminary work-up and clinical course  These are curbside recommendations based on brief review of the chart and discussion with Dr. Cleophas Dunker  MD-PhD [facility] [phone] Available 7 PM to 7 AM, outside of these hours please call Neurologist on call as listed on Amion.

## 2021-11-18 NOTE — Assessment & Plan Note (Signed)
NA 127, last checked a year ago 138.  Likely hypervolemic hyponatremia.  Has also had poor oral intake.  And is on Lasix 20 mg daily. - Monitor with diuresis -Urine sodium, urine osmolality, serum osmolality

## 2021-11-18 NOTE — Assessment & Plan Note (Signed)
Stable. -Resume Norvasc, losartan

## 2021-11-18 NOTE — ED Triage Notes (Signed)
Pt brought in by [facility] for c/o weakness per family  Ems states when they got on scene pt was alert and oriented, she changed clothes and then smoked a cigarette while the ems responders got the stretcher  Family told ems pt has been weak, not wanting to get out of bed  O2 sats 84% with ems, O2 applied at 4L and sats increased to 96%  Pt is alert upon arrival and denies any pain with ems

## 2021-11-19 DIAGNOSIS — I469 Cardiac arrest, cause unspecified: Secondary | ICD-10-CM

## 2021-11-19 LAB — OSMOLALITY, URINE: Osmolality, Ur: 536 mOsm/kg (ref 300–900)

## 2021-11-19 LAB — GLUCOSE, CAPILLARY: Glucose-Capillary: 137 mg/dL — ABNORMAL HIGH (ref 70–99)

## 2021-11-19 LAB — OSMOLALITY: Osmolality: 276 mOsm/kg (ref 275–295)

## 2021-12-04 NOTE — Progress Notes (Signed)
Rapid response  Rapid response was called due to patient's heart rate decreasing to the 30s.  At bedside, patient was unresponsive, cyanotic, pupil was fixed and unreactive.  Apparently, patient's HR went from 67 > 40 > 36.  CPR was initially going to be started on patient, but it was realized that patient was a DNR, so this was not performed.  She went from bradycardia to V-fib to asystole. No breath sounds or heart sounds noted on auscultation 12:58 AM.

## 2021-12-04 NOTE — Progress Notes (Signed)
Pt was showing abnormal heart rhythm through the tele monitor per tele tech so this RN went to check on the pt. While trying to awaken pt, tele tech called once again to inform this RN that pts heart rate is progressively decreasing to the 30s. Pts breathing also slowed down. Pt was unresponsive to any stimuli and started turning cyanotic. Attempted to call family but no one answered. Pt is DNR and had a DNR armband on so unable to perform CPR per pts wishes. MD pronounced time of death at 76. Family was called once again and daughter Charisse Klinefelter answered and was informed of pts death. Daughter also spoke with the doctor over the phone. Family does not have any means of transportation for tonight and will come see the pt at the morgue in the morning. Daughter still has to discuss with other family members about funeral arrangements.

## 2021-12-04 NOTE — Discharge Summary (Addendum)
Death Summary  Tina Tucker EVO:[id number] DOB: 10-29-1932 DOA: 12/12/2021  PCP: Neale Burly, MD  Admit date: 2021-12-12 Date of Death: 2021-12-13 Time of Death: 12:58 AM Notification: Neale Burly, MD notified of death of 12-13-21   History of present illness:  Tina Tucker is a 88 y.o. female with a history of COPD, hypertension, peripheral artery disease.  Tina Tucker presented with complaint of difficulty breathing and generalized weakness  Tina Tucker went into cardiac arrest, she was a DNR, so CPR and ACLS was unable to be performed.    Patient was admitted a few hours early having presented to the emergency department due to 1 month and a half of difficulty breathing and generalized weakness which worsens when she started sitting in a recliner more frequently over the past 3 days.  Prior to this, patient was mostly bedbound without interest to get up from bed, since any degree of activity makes her to be short of breath and makes her face and hands turn blue.  Patient has been more quiet/reserved lately per family and she has developed worsening cough within the last 3 days.  EMS was activated and while EMS team was at the scene, patient smoked a cigarette, she was placed on the stretcher, O2 sats was 84% and supplemental oxygen via [place] at 4 LPM was provided.  Chest x-ray in the ED showed left lower lung opacity suggesting atelectasis with moderate-sized pleural effusion, underlying airspace disease can not be excluded.  Right base atelectasis, small right pleural effusion. CT head-left temporal low-density with possible sulcal effacement suggesting edema or acute infarction.  MRI recommended. EDP consulted neurologist Dr. Curly Shores, recommended admission, get MRI W/Wo contrast and CTA head and neck when patient is stabilized and able to lay flat.  Patient went into cardiac arrest in less than 2 hours after being taken to her room from the ED.  Patient was a  DNR.   Final Diagnoses:  1.   Cardiac arrest 2.  Acute respiratory failure with hypoxia   The results of significant diagnostics from this hospitalization (including imaging, microbiology, ancillary and laboratory) are listed below for reference.    Significant Diagnostic Studies: CT Head Wo Contrast  Result Date: 2021-12-12 CLINICAL DATA:  Altered mental status.  Weakness. EXAM: CT HEAD WITHOUT CONTRAST TECHNIQUE: Contiguous axial images were obtained from the base of the skull through the vertex without intravenous contrast. RADIATION DOSE REDUCTION: This exam was performed according to the departmental dose-optimization program which includes automated exposure control, adjustment of the mA and/or kV according to patient size and/or use of iterative reconstruction technique. COMPARISON:  None Available. FINDINGS: Brain: Mild chronic ischemic white matter disease is noted. Ventricular size is within normal limits. Left temporal low density is noted with possible sulcal effacement suggesting edema or acute infarction. No definite hemorrhage or midline shift is noted. Vascular: No hyperdense vessel or unexpected calcification. Skull: Normal. Negative for fracture or focal lesion. Sinuses/Orbits: No acute finding. Other: None. IMPRESSION: Left temporal low density is noted with possible sulcal effacement suggesting edema or acute infarction. MRI is recommended for further evaluation. Electronically Signed   By: Marijo Conception M.D.   On: December 12, 2021 18:39   DG Chest 2 View  Result Date: 12/12/21 CLINICAL DATA:  Generalized weakness. EXAM: CHEST - 2 VIEW COMPARISON:  No recent examination available for comparison. FINDINGS: The heart is enlarged. Prominent atherosclerotic calcification of aorta. Left lower lung opacity suggesting atelectasis with moderate-sized pleural effusion. Right  basilar atelectasis with small right pleural effusion. Osteopenia and moderate thoracic spondylosis. IMPRESSION: 1.  Cardiomegaly. 2. Left lower lung opacity suggesting atelectasis with moderate-sized pleural effusion, underlying airspace disease can not be excluded. 3. Right basilar atelectasis with small right pleural effusion. Electronically Signed   By: Keane Police D.O.   On: 11/16/2021 16:00    Microbiology: Recent Results (from the past 240 hour(s))  Resp Panel by RT-PCR (Flu A&B, Covid) Anterior Nasal Swab     Status: None   Collection Time: 12/03/2021  3:34 PM   Specimen: Anterior Nasal Swab  Result Value Ref Range Status   SARS Coronavirus 2 by RT PCR NEGATIVE NEGATIVE Final    Comment: (NOTE) SARS-CoV-2 target nucleic acids are NOT DETECTED.  The SARS-CoV-2 RNA is generally detectable in upper respiratory specimens during the acute phase of infection. The lowest concentration of SARS-CoV-2 viral copies this assay can detect is 138 copies/mL. A negative result does not preclude SARS-Cov-2 infection and should not be used as the sole basis for treatment or other patient management decisions. A negative result may occur with  improper specimen collection/handling, submission of specimen other than nasopharyngeal swab, presence of viral mutation(s) within the areas targeted by this assay, and inadequate number of viral copies(<138 copies/mL). A negative result must be combined with clinical observations, patient history, and epidemiological information. The expected result is Negative.  Fact Sheet for Patients:  EntrepreneurPulse.com.au  Fact Sheet for Healthcare Providers:  IncredibleEmployment.be  This test is no t yet approved or cleared by the [place] FDA and  has been authorized for detection and/or diagnosis of SARS-CoV-2 by FDA under an Emergency Use Authorization (EUA). This EUA will remain  in effect (meaning this test can be used) for the duration of the COVID-19 declaration under Section 564(b)(1) of the Act, 21 U.S.C.section  360bbb-3(b)(1), unless the authorization is terminated  or revoked sooner.       Influenza A by PCR NEGATIVE NEGATIVE Final   Influenza B by PCR NEGATIVE NEGATIVE Final    Comment: (NOTE) The Xpert Xpress SARS-CoV-2/FLU/RSV plus assay is intended as an aid in the diagnosis of influenza from Nasopharyngeal swab specimens and should not be used as a sole basis for treatment. Nasal washings and aspirates are unacceptable for Xpert Xpress SARS-CoV-2/FLU/RSV testing.  Fact Sheet for Patients: EntrepreneurPulse.com.au  Fact Sheet for Healthcare Providers: IncredibleEmployment.be  This test is not yet approved or cleared by the [place] FDA and has been authorized for detection and/or diagnosis of SARS-CoV-2 by FDA under an Emergency Use Authorization (EUA). This EUA will remain in effect (meaning this test can be used) for the duration of the COVID-19 declaration under Section 564(b)(1) of the Act, 21 U.S.C. section 360bbb-3(b)(1), unless the authorization is terminated or revoked.  Performed at [facility], [address], [place], [place] [address]      Labs: Basic Metabolic Panel: Recent Labs  Lab 12/02/2021 1628 11/05/2021 1717  NA 127*  --   K 4.1  --   CL 88*  --   CO2 28  --   GLUCOSE 133*  --   BUN 20  --   CREATININE 0.83  --   CALCIUM 8.5*  --   MG  --  2.4   Liver Function Tests: No results for input(s): "AST", "ALT", "ALKPHOS", "BILITOT", "PROT", "ALBUMIN" in the last 168 hours. No results for input(s): "LIPASE", "AMYLASE" in the last 168 hours. No results for input(s): "AMMONIA" in the last 168 hours. CBC:  Recent Labs  Lab 11/22/2021 1628  WBC 6.1  NEUTROABS 5.2  HGB 17.7*  HCT 54.5*  MCV 91.6  PLT 344   Cardiac Enzymes: No results for input(s): "CKTOTAL", "CKMB", "CKMBINDEX", "TROPONINI" in the last 168 hours. D-Dimer No results for input(s): "DDIMER" in the last 72 hours. BNP: Invalid input(s):  "POCBNP" CBG: Recent Labs  Lab 12/06/2021 0054  GLUCAP 137*   Anemia work up No results for input(s): "VITAMINB12", "FOLATE", "FERRITIN", "TIBC", "IRON", "RETICCTPCT" in the last 72 hours. Urinalysis    Component Value Date/Time   COLORURINE YELLOW 11/05/2021 [place] 11/12/2021 1744   LABSPEC 1.020 11/04/2021 1744   PHURINE 5.0 11/16/2021 1744   GLUCOSEU NEGATIVE 11/10/2021 1744   HGBUR NEGATIVE 12/03/2021 1744   BILIRUBINUR NEGATIVE 11/22/2021 1744   KETONESUR 5 (A) 11/14/2021 1744   PROTEINUR 100 (A) 11/10/2021 1744   UROBILINOGEN 0.2 06/10/2010 1230   NITRITE NEGATIVE 11/20/2021 1744   LEUKOCYTESUR NEGATIVE 12/02/2021 1744   Sepsis Labs Recent Labs  Lab 11/20/2021 1628  WBC 6.1       SIGNED:  Bernadette Hoit, MD  [facility] Dec 06, 2021, 1:44 AM  If 7PM-7AM, please contact night-coverage [email or web address]

## 2021-12-04 DEATH — deceased
